# Patient Record
Sex: Female | Born: 1965 | Hispanic: Yes | Smoking: Current every day smoker
Health system: Southern US, Community
[De-identification: ages and names within clinical notes are randomized; demographics above are authoritative.]

## PROBLEM LIST (undated history)

## (undated) DIAGNOSIS — F419 Anxiety disorder, unspecified: Secondary | ICD-10-CM

## (undated) DIAGNOSIS — I10 Essential (primary) hypertension: Secondary | ICD-10-CM

## (undated) DIAGNOSIS — F329 Major depressive disorder, single episode, unspecified: Secondary | ICD-10-CM

## (undated) DIAGNOSIS — F32A Depression, unspecified: Secondary | ICD-10-CM

## (undated) DIAGNOSIS — E785 Hyperlipidemia, unspecified: Secondary | ICD-10-CM

## (undated) HISTORY — PX: TUBAL LIGATION: SHX77

## (undated) HISTORY — DX: Hyperlipidemia, unspecified: E78.5

## (undated) HISTORY — DX: Anxiety disorder, unspecified: F41.9

## (undated) HISTORY — DX: Depression, unspecified: F32.A

## (undated) HISTORY — PX: TONSILLECTOMY: SUR1361

---
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
## 2004-12-12 DIAGNOSIS — I10 Essential (primary) hypertension: Secondary | ICD-10-CM

## 2004-12-12 HISTORY — DX: Essential (primary) hypertension: I10

## 2014-11-21 ENCOUNTER — Emergency Department (HOSPITAL_COMMUNITY): Payer: BC Managed Care – PPO

## 2014-11-21 ENCOUNTER — Emergency Department (HOSPITAL_COMMUNITY)
Admission: EM | Admit: 2014-11-21 | Discharge: 2014-11-21 | Disposition: A | Discharge status: Home / Self Care | Payer: BC Managed Care – PPO | Attending: Emergency Medicine | Admitting: Emergency Medicine

## 2014-11-21 ENCOUNTER — Encounter (HOSPITAL_COMMUNITY): Payer: Self-pay | Admitting: *Deleted

## 2014-11-21 DIAGNOSIS — Z720 Tobacco use: Secondary | ICD-10-CM | POA: Diagnosis not present

## 2014-11-21 DIAGNOSIS — R05 Cough: Secondary | ICD-10-CM | POA: Diagnosis present

## 2014-11-21 DIAGNOSIS — R059 Cough, unspecified: Secondary | ICD-10-CM

## 2014-11-21 DIAGNOSIS — J069 Acute upper respiratory infection, unspecified: Secondary | ICD-10-CM | POA: Diagnosis not present

## 2014-11-21 MED ORDER — DEXTROMETHORPHAN POLISTIREX 30 MG/5ML PO LQCR: 15.0000 mg | Freq: Two times a day (BID) | ORAL | Status: DC

## 2014-11-21 MED ORDER — PSEUDOEPHEDRINE HCL 30 MG PO TABS: 30.0000 mg | ORAL_TABLET | Freq: Four times a day (QID) | ORAL | Status: DC | PRN

## 2014-11-21 NOTE — Discharge Instructions (Signed)
Upper Respiratory Infection, Adult An upper respiratory infection (URI) is also sometimes known as the common cold. The upper respiratory tract includes the nose, sinuses, throat, trachea, and bronchi. Bronchi are the airways leading to the lungs. Most people improve within 1 week, but symptoms can last up to 2 weeks. A residual cough may last even longer.  CAUSES Many different viruses can infect the tissues lining the upper respiratory tract. The tissues become irritated and inflamed and often become very moist. Mucus production is also common. A cold is contagious. You can easily spread the virus to others by oral contact. This includes kissing, sharing a glass, coughing, or sneezing. Touching your mouth or nose and then touching a surface, which is then touched by another person, can also spread the virus. SYMPTOMS  Symptoms typically develop 1 to 3 days after you come in contact with a cold virus. Symptoms vary from person to person. They may include:  Runny nose.  Sneezing.  Nasal congestion.  Sinus irritation.  Sore throat.  Loss of voice (laryngitis).  Cough.  Fatigue.  Muscle aches.  Loss of appetite.  Headache.  Low-grade fever. DIAGNOSIS  You might diagnose your own cold based on familiar symptoms, since most people get a cold 2 to 3 times a year. Your caregiver can confirm this based on your exam. Most importantly, your caregiver can check that your symptoms are not due to another disease such as strep throat, sinusitis, pneumonia, asthma, or epiglottitis. Blood tests, throat tests, and X-rays are not necessary to diagnose a common cold, but they may sometimes be helpful in excluding other more serious diseases. Your caregiver will decide if any further tests are required. RISKS AND COMPLICATIONS  You may be at risk for a more severe case of the common cold if you smoke cigarettes, have chronic heart disease (such as heart failure) or lung disease (such as asthma), or if  you have a weakened immune system. The very young and very old are also at risk for more serious infections. Bacterial sinusitis, middle ear infections, and bacterial pneumonia can complicate the common cold. The common cold can worsen asthma and chronic obstructive pulmonary disease (COPD). Sometimes, these complications can require emergency medical care and may be life-threatening. PREVENTION  The best way to protect against getting a cold is to practice good hygiene. Avoid oral or hand contact with people with cold symptoms. Wash your hands often if contact occurs. There is no clear evidence that vitamin C, vitamin E, echinacea, or exercise reduces the chance of developing a cold. However, it is always recommended to get plenty of rest and practice good nutrition. TREATMENT  Treatment is directed at relieving symptoms. There is no cure. Antibiotics are not effective, because the infection is caused by a virus, not by bacteria. Treatment may include:  Increased fluid intake. Sports drinks offer valuable electrolytes, sugars, and fluids.  Breathing heated mist or steam (vaporizer or shower).  Eating chicken soup or other clear broths, and maintaining good nutrition.  Getting plenty of rest.  Using gargles or lozenges for comfort.  Controlling fevers with ibuprofen or acetaminophen as directed by your caregiver.  Increasing usage of your inhaler if you have asthma. Zinc gel and zinc lozenges, taken in the first 24 hours of the common cold, can shorten the duration and lessen the severity of symptoms. Pain medicines may help with fever, muscle aches, and throat pain. A variety of non-prescription medicines are available to treat congestion and runny nose. Your caregiver   can make recommendations and may suggest nasal or lung inhalers for other symptoms.  HOME CARE INSTRUCTIONS   Only take over-the-counter or prescription medicines for pain, discomfort, or fever as directed by your  caregiver.  Use a warm mist humidifier or inhale steam from a shower to increase air moisture. This may keep secretions moist and make it easier to breathe.  Drink enough water and fluids to keep your urine clear or pale yellow.  Rest as needed.  Return to work when your temperature has returned to normal or as your caregiver advises. You may need to stay home longer to avoid infecting others. You can also use a face mask and careful hand washing to prevent spread of the virus. SEEK MEDICAL CARE IF:   After the first few days, you feel you are getting worse rather than better.  You need your caregiver's advice about medicines to control symptoms.  You develop chills, worsening shortness of breath, or brown or red sputum. These may be signs of pneumonia.  You develop yellow or brown nasal discharge or pain in the face, especially when you bend forward. These may be signs of sinusitis.  You develop a fever, swollen neck glands, pain with swallowing, or white areas in the back of your throat. These may be signs of strep throat. SEEK IMMEDIATE MEDICAL CARE IF:   You have a fever.  You develop severe or persistent headache, ear pain, sinus pain, or chest pain.  You develop wheezing, a prolonged cough, cough up blood, or have a change in your usual mucus (if you have chronic lung disease).  You develop sore muscles or a stiff neck. Document Released: 05/24/2001 Document Revised: 02/20/2012 Document Reviewed: 03/05/2014 Exit[employer] Patient Information 2015 [employer]. This information is not intended to replace advice given to you by your health care provider. Make sure you discuss any questions you have with your health care provider.  

## 2014-11-21 NOTE — ED Notes (Signed)
Patient states coughing and congestion x 2 days, patient states runny nose and sinus pressure in face, patient states a little bit of blood in sputum after coughing so much,

## 2014-11-21 NOTE — ED Provider Notes (Signed)
CSN: [id number]     Arrival date & time 11/21/14  [id number] History   First MD Initiated Contact with Patient 11/21/14 0911     Chief Complaint  Patient presents with  . URI     (Consider location/radiation/quality/duration/timing/severity/associated sxs/prior Treatment) HPI Comments: Patient presents to the emergency department with chief complaint of cough, nasal congestion, and runny nose 2-3 days. She denies any associated fever or chills. Denies any chest pain except for when she is coughing. She states that she has coughed up some yellow sputum that has been blood-tinged at times. She has tried using nasal saline and Benadryl for her symptoms with no relief. There are no aggravating factors. Patient denies any sick contacts. She states that she has a heavy smoker.  The history is provided by the patient. No language interpreter was used.    History reviewed. No pertinent past medical history. Past Surgical History  Procedure Laterality Date  . Tonsillectomy     No family history on file. History  Substance Use Topics  . Smoking status: Current Every Day Smoker  . Smokeless tobacco: Not on file  . Alcohol Use: Yes   OB History    No data available     Review of Systems  Constitutional: Negative for fever and chills.  HENT: Positive for congestion, postnasal drip, sinus pressure and sore throat.   Respiratory: Positive for cough. Negative for shortness of breath.   Cardiovascular: Negative for chest pain.  Gastrointestinal: Negative for nausea, vomiting, diarrhea and constipation.  Genitourinary: Negative for dysuria.      Allergies  Asa  Home Medications   Prior to Admission medications   Medication Sig Start Date End Date Taking? Authorizing Provider  diphenhydrAMINE (SOMINEX) 25 MG tablet Take 25 mg by mouth daily as needed for allergies.   Yes Historical Provider, MD  OVER THE COUNTER MEDICATION Place 1 spray into both nostrils daily as needed (for dry nose).  Nasal spray   Yes Historical Provider, MD  Pseudoeph-Doxylamine-DM-APAP (NYQUIL PO) Take 30 mLs by mouth every 6 (six) hours as needed (for cough and cold symptoms).   Yes Historical Provider, MD   BP 137/91 mmHg  Temp(Src) 98.1 F (36.7 C) (Oral)  Resp 20  Ht 5\' 5"  (1.651 m)  Wt 120 lb (54.432 kg)  BMI 19.97 kg/m2  SpO2 100% Physical Exam  Constitutional: She appears well-developed and well-nourished. No distress.  HENT:  Head: Normocephalic.  Right Ear: External ear normal.  Left Ear: External ear normal.  Mildly erythematous, no tonsillar exudate, no abscess, no stridor, uvula is midline  TMs clear bilaterally  Eyes: Conjunctivae and EOM are normal. Pupils are equal, round, and reactive to light.  Neck: Normal range of motion. Neck supple.  Cardiovascular: Normal rate, regular rhythm and normal heart sounds.  Exam reveals no gallop and no friction rub.   No murmur heard. Pulmonary/Chest: Effort normal. No stridor. No respiratory distress. She has no wheezes. She has rales. She exhibits no tenderness.  Right-sided lower lobe crackles  Abdominal: Soft. Bowel sounds are normal. She exhibits no distension. There is no tenderness.  Musculoskeletal: Normal range of motion. She exhibits no tenderness.  Neurological: She is alert.  Skin: Skin is warm and dry. No rash noted. She is not diaphoretic.  Psychiatric: She has a normal mood and affect. Her behavior is normal. Judgment and thought content normal.  Nursing note and vitals reviewed.   ED Course  Procedures (including critical care time) Labs Review Labs Reviewed -  No data to display  Imaging Review Dg Chest 2 View  11/21/2014   CLINICAL DATA:  Cough.  EXAM: CHEST  2 VIEW  COMPARISON:  None.  FINDINGS: The heart size and mediastinal contours are within normal limits. Both lungs are clear. No pneumothorax or pleural effusion is noted. The visualized skeletal structures are unremarkable.  IMPRESSION: No acute cardiopulmonary  abnormality seen.   Electronically Signed   By: Roque LiasJames  Green M.D.   On: 11/21/2014 10:02     EKG Interpretation None      MDM   Final diagnoses:  Cough  URI, acute    Pt CXR negative for acute infiltrate. Patients symptoms are consistent with URI, likely viral etiology. Discussed that antibiotics are not indicated for viral infections. Pt will be discharged with symptomatic treatment.  Verbalizes understanding and is agreeable with plan. Pt is hemodynamically stable & in NAD prior to dc.     Roxy Horsemanobert Yuniel Blaney, PA-C 11/21/14 1014  Joya Gaskinsonald W Wickline, MD 11/21/14 1254

## 2016-05-05 ENCOUNTER — Ambulatory Visit (HOSPITAL_COMMUNITY)
Admission: EM | Admit: 2016-05-05 | Discharge: 2016-05-05 | Disposition: A | Discharge status: Home / Self Care | Payer: BLUE CROSS/BLUE SHIELD | Attending: Family Medicine | Admitting: Family Medicine

## 2016-05-05 ENCOUNTER — Encounter (HOSPITAL_COMMUNITY): Payer: Self-pay | Admitting: Emergency Medicine

## 2016-05-05 DIAGNOSIS — F418 Other specified anxiety disorders: Secondary | ICD-10-CM | POA: Diagnosis not present

## 2016-05-05 MED ORDER — LORAZEPAM 1 MG PO TABS: 1.0000 mg | ORAL_TABLET | Freq: Two times a day (BID) | ORAL | Status: DC

## 2016-05-05 NOTE — ED Notes (Signed)
Pt reports stress at work due to coworker and feeling like BP has been elevated and feeling anxious Hx of HTN... PCP adv pt to hold off atenolol 25 mg until physical exam A&O x4... No acute distress.

## 2016-05-05 NOTE — ED Provider Notes (Signed)
CSN: [id number]     Arrival date & time 05/05/16  1401 History   First MD Initiated Contact with Patient 05/05/16 1456     Chief Complaint  Patient presents with  . Stress   (Consider location/radiation/quality/duration/timing/severity/associated sxs/prior Treatment) Patient is a 50 y.o. female presenting with mental health disorder. The history is provided by the patient.  Mental Health Problem Presenting symptoms: no self mutilation and no suicidal thoughts   Presenting symptoms comment:  Pt with interpersonal problem with coworker, now stressed. Degree of incapacity (severity):  Mild Onset quality:  Gradual Duration:  3 days Chronicity:  New Relieved by:  None tried Worsened by:  Nothing tried Ineffective treatments:  None tried Associated symptoms: anxiety and insomnia   Risk factors: no hx of suicide attempts     History reviewed. No pertinent past medical history. Past Surgical History  Procedure Laterality Date  . Tonsillectomy     No family history on file. Social History  Substance Use Topics  . Smoking status: Current Every Day Smoker  . Smokeless tobacco: None  . Alcohol Use: Yes   OB History    No data available     Review of Systems  Constitutional: Negative.   Skin: Negative.   Psychiatric/Behavioral: Positive for sleep disturbance. Negative for suicidal ideas, behavioral problems and self-injury. The patient is nervous/anxious and has insomnia.   All other systems reviewed and are negative.   Allergies  Asa  Home Medications   Prior to Admission medications   Medication Sig Start Date End Date Taking? Authorizing Provider  dextromethorphan (DELSYM) 30 MG/5ML liquid Take 2.5 mLs (15 mg total) by mouth 2 (two) times daily. 11/21/14   Roxy Horseman, PA-C  diphenhydrAMINE (SOMINEX) 25 MG tablet Take 25 mg by mouth daily as needed for allergies.    Historical Provider, MD  LORazepam (ATIVAN) 1 MG tablet Take 1 tablet (1 mg total) by mouth 2 (two)  times daily. For anxiety 05/05/16   Linna Hoff, MD  OVER THE COUNTER MEDICATION Place 1 spray into both nostrils daily as needed (for dry nose). Nasal spray    Historical Provider, MD  Pseudoeph-Doxylamine-DM-APAP (NYQUIL PO) Take 30 mLs by mouth every 6 (six) hours as needed (for cough and cold symptoms).    Historical Provider, MD  pseudoephedrine (SUDAFED) 30 MG tablet Take 1 tablet (30 mg total) by mouth every 6 (six) hours as needed for congestion. 11/21/14   Roxy Horseman, PA-C   Meds Ordered and Administered this Visit  Medications - No data to display  BP 142/88 mmHg  Pulse 96  Temp(Src) 98.6 F (37 C) (Oral)  Resp 16  SpO2 100% No data found.   Physical Exam  Constitutional: She is oriented to person, place, and time. She appears well-developed and well-nourished.  Cardiovascular: Regular rhythm and normal heart sounds.   Pulmonary/Chest: Effort normal and breath sounds normal.  Neurological: She is alert and oriented to person, place, and time.  Skin: Skin is warm and dry.  Psychiatric: Her behavior is normal.  Nursing note and vitals reviewed.   ED Course  Procedures (including critical care time)  Labs Review Labs Reviewed - No data to display  Imaging Review No results found.   Visual Acuity Review  Right Eye Distance:   Left Eye Distance:   Bilateral Distance:    Right Eye Near:   Left Eye Near:    Bilateral Near:         MDM   1. Situational anxiety  Linna HoffJames D Kindl, MD 05/05/16 [id number]

## 2016-05-13 ENCOUNTER — Other Ambulatory Visit (HOSPITAL_COMMUNITY)
Admission: RE | Admit: 2016-05-13 | Discharge: 2016-05-13 | Disposition: A | Discharge status: Home / Self Care | Payer: BLUE CROSS/BLUE SHIELD | Source: Ambulatory Visit | Attending: Family Medicine | Admitting: Family Medicine

## 2016-05-13 ENCOUNTER — Other Ambulatory Visit: Payer: Self-pay

## 2016-05-13 ENCOUNTER — Other Ambulatory Visit: Payer: Self-pay | Admitting: Family

## 2016-05-13 DIAGNOSIS — Z01411 Encounter for gynecological examination (general) (routine) with abnormal findings: Secondary | ICD-10-CM | POA: Diagnosis not present

## 2016-05-13 DIAGNOSIS — Z1231 Encounter for screening mammogram for malignant neoplasm of breast: Secondary | ICD-10-CM

## 2016-05-19 LAB — CYTOLOGY - PAP

## 2017-12-24 ENCOUNTER — Other Ambulatory Visit: Payer: Self-pay

## 2017-12-24 ENCOUNTER — Ambulatory Visit (HOSPITAL_COMMUNITY)
Admission: EM | Admit: 2017-12-24 | Discharge: 2017-12-24 | Disposition: A | Discharge status: Home / Self Care | Payer: BLUE CROSS/BLUE SHIELD | Attending: Family Medicine | Admitting: Family Medicine

## 2017-12-24 DIAGNOSIS — R51 Headache: Principal | ICD-10-CM

## 2017-12-24 DIAGNOSIS — R519 Headache, unspecified: Secondary | ICD-10-CM

## 2017-12-24 MED ORDER — BUTALBITAL-APAP-CAFFEINE 50-325-40 MG PO TABS: 1.0000 | ORAL_TABLET | Freq: Four times a day (QID) | ORAL | 0 refills | Status: AC | PRN

## 2017-12-24 MED ORDER — SERTRALINE HCL 50 MG PO TABS: 50.0000 mg | ORAL_TABLET | Freq: Every day | ORAL | 1 refills | Status: DC

## 2017-12-24 NOTE — ED Provider Notes (Signed)
  [facility]   [id number] 12/24/17 Arrival Time: 1726   SUBJECTIVE:  Anne Combs is a 52 y.o. female who presents to the urgent care with complaint of left sided headache.  Patient works for [employer] and is married and has been under great stress because her husband is being treated for cancer his last several months.  Patient has lost power in her house.  She lost interest in having relations with her husband, is not sleeping well, and feels tired much of the time.  She is originally from [place]  No past medical history on file. No family history on file. Social History   Socioeconomic History  . Marital status: Single    Spouse name: Not on file  . Number of children: Not on file  . Years of education: Not on file  . Highest education level: Not on file  Social Needs  . Financial resource strain: Not on file  . Food insecurity - worry: Not on file  . Food insecurity - inability: Not on file  . Transportation needs - medical: Not on file  . Transportation needs - non-medical: Not on file  Occupational History  . Not on file  Tobacco Use  . Smoking status: Current Every Day Smoker  Substance and Sexual Activity  . Alcohol use: Yes  . Drug use: Not on file  . Sexual activity: No  Other Topics Concern  . Not on file  Social History Narrative  . Not on file   No outpatient medications have been marked as taking for the 12/24/17 encounter [facility](Hospital Encounter).   Allergies  Allergen Reactions  . Asa [Aspirin] Itching    One eye itches      ROS: As per HPI, remainder of ROS negative.   OBJECTIVE:   Vitals:   12/24/17 1830  BP: (!) 151/98  Pulse: 100  Temp: 97.9 F (36.6 C)  SpO2: 98%     General appearance: alert; no distress Eyes: PERRL; EOMI; conjunctiva normal HENT: normocephalic; atraumatic; TMs normal, canal normal, external ears normal without trauma; nasal mucosa normal; oral mucosa normal Neck: supple Lungs:  clear to auscultation bilaterally Heart: regular rate and rhythm Back: no CVA tenderness Extremities: no cyanosis or edema; symmetrical with no gross deformities Skin: warm and dry Neurologic: normal gait; grossly normal Psychological: alert and cooperative; normal mood and affect      Labs:  Results for orders placed or performed in visit on 05/13/16  Cytology - PAP  Result Value Ref Range   CYTOLOGY - PAP PAP RESULT     Labs Reviewed - No data to display  No results found.     ASSESSMENT & PLAN:  1. Bad headache     Meds ordered this encounter  Medications  . sertraline (ZOLOFT) 50 MG tablet    Sig: Take 1 tablet (50 mg total) by mouth daily.    Dispense:  30 tablet    Refill:  1  . butalbital-acetaminophen-caffeine (FIORICET, ESGIC) 50-325-40 MG tablet    Sig: Take 1-2 tablets by mouth every 6 (six) hours as needed for headache.    Dispense:  20 tablet    Refill:  0    Reviewed expectations re: course of current medical issues. Questions answered. Outlined signs and symptoms indicating need for more acute intervention. Patient verbalized understanding. After Visit Summary given.    Procedures:      Elvina SidleLauenstein, Kenston Longton, MD 12/24/17 1849

## 2017-12-24 NOTE — ED Triage Notes (Signed)
Per pt she is having pain on her left temple, Per pt she is going threw a lot of stress in her life right now.

## 2018-02-04 ENCOUNTER — Emergency Department (HOSPITAL_COMMUNITY)
Admission: EM | Admit: 2018-02-04 | Discharge: 2018-02-04 | Disposition: A | Discharge status: Home / Self Care | Payer: BLUE CROSS/BLUE SHIELD | Attending: Emergency Medicine | Admitting: Emergency Medicine

## 2018-02-04 ENCOUNTER — Emergency Department (HOSPITAL_COMMUNITY): Payer: BLUE CROSS/BLUE SHIELD

## 2018-02-04 ENCOUNTER — Encounter (HOSPITAL_COMMUNITY): Payer: Self-pay | Admitting: Emergency Medicine

## 2018-02-04 DIAGNOSIS — M79645 Pain in left finger(s): Secondary | ICD-10-CM | POA: Diagnosis not present

## 2018-02-04 DIAGNOSIS — X58XXXA Exposure to other specified factors, initial encounter: Secondary | ICD-10-CM | POA: Insufficient documentation

## 2018-02-04 DIAGNOSIS — Y999 Unspecified external cause status: Secondary | ICD-10-CM | POA: Insufficient documentation

## 2018-02-04 DIAGNOSIS — Y939 Activity, unspecified: Secondary | ICD-10-CM | POA: Insufficient documentation

## 2018-02-04 DIAGNOSIS — Y929 Unspecified place or not applicable: Secondary | ICD-10-CM | POA: Insufficient documentation

## 2018-02-04 DIAGNOSIS — Z79899 Other long term (current) drug therapy: Secondary | ICD-10-CM | POA: Insufficient documentation

## 2018-02-04 DIAGNOSIS — F17200 Nicotine dependence, unspecified, uncomplicated: Secondary | ICD-10-CM | POA: Diagnosis not present

## 2018-02-04 MED ORDER — ACETAMINOPHEN 325 MG PO TABS
650.0000 mg | ORAL_TABLET | Freq: Once | ORAL | Status: AC
Start: 2018-02-04 — End: 2018-02-04
  Administered 2018-02-04: 650 mg via ORAL
  Filled 2018-02-04: qty 2

## 2018-02-04 NOTE — ED Provider Notes (Signed)
[facility] EMERGENCY DEPARTMENT Provider Note   CSN: [id number] Arrival date & time: 02/04/18  0744     History   Chief Complaint Chief Complaint  Patient presents with  . Finger Injury    HPI Anne Combs is a 52 y.o. female who presents today for evaluation of left thumb and wrist pain.  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  She reports that she has chronic left thumb pain, however approximately 1 week ago she bent her thumb back causing a significant increase in her pain.  She reports that she is able to move the tip of her thumb, however moving the base of her thumb causes significant pain.  No numbness or tingling.  She does not have a primary care doctor.  No swelling or redness, no fevers or chills.    She has not tried tylenol, ibuprofen, ice or heat.  She tried wearing a compression fingerless glove with out relief.   HPI  History reviewed. No pertinent past medical history.  There are no active problems to display for this patient.   Past Surgical History:  Procedure Laterality Date  . TONSILLECTOMY      OB History    No data available       Home Medications    Prior to Admission medications   Medication Sig Start Date End Date Taking? Authorizing Provider  butalbital-acetaminophen-caffeine (FIORICET, ESGIC) [phone] MG tablet Take 1-2 tablets by mouth every 6 (six) hours as needed for headache. 12/24/17 12/24/18  Elvina Sidle, MD  dextromethorphan (DELSYM) 30 MG/5ML liquid Take 2.5 mLs (15 mg total) by mouth 2 (two) times daily. 11/21/14   Roxy Horseman, PA-C  diphenhydrAMINE (SOMINEX) 25 MG tablet Take 25 mg by mouth daily as needed for allergies.    [provider]  LORazepam (ATIVAN) 1 MG tablet Take 1 tablet (1 mg total) by mouth 2 (two) times daily. For anxiety 05/05/16   Linna Hoff, MD  OVER THE COUNTER MEDICATION Place 1 spray  into both nostrils daily as needed (for dry nose). Nasal spray    [provider]  Pseudoeph-Doxylamine-DM-APAP (NYQUIL PO) Take 30 mLs by mouth every 6 (six) hours as needed (for cough and cold symptoms).    [provider]  pseudoephedrine (SUDAFED) 30 MG tablet Take 1 tablet (30 mg total) by mouth every 6 (six) hours as needed for congestion. 11/21/14   Roxy Horseman, PA-C  sertraline (ZOLOFT) 50 MG tablet Take 1 tablet (50 mg total) by mouth daily. 12/24/17   Elvina Sidle, MD    Family History History reviewed. No pertinent family history.  Social History Social History   Tobacco Use  . Smoking status: Current Every Day Smoker  . Smokeless tobacco: Never Used  Substance Use Topics  . Alcohol use: Yes  . Drug use: Not on file     Allergies   Asa [aspirin]   Review of Systems Review of Systems  Constitutional: Negative for chills and fever.  Musculoskeletal: Negative for neck pain and neck stiffness.       Left thumb pain  Skin: Negative for color change, rash and wound.  All other systems reviewed and are negative.    Physical Exam Updated Vital Signs BP (!) 167/83 (BP Location: Left Arm)   Pulse (!) 108   Temp 98.3 F (36.8 C) (Oral)   Resp 18   SpO2 100%   Physical  Exam  Constitutional: She appears well-developed and well-nourished.  Neck: Normal range of motion. Neck supple.  No midline TTP.  Cardiovascular:  Left hand is warm and well perfused  Musculoskeletal:  Left thumb pain with active and passive ROM.  She does not have any crepitus.  Her pain is worse at the first mcp joint and Pip joint.  TTP along dorsal aspect over the first dorsal compartment.    Neurological:  Tach sensation to left hand  Skin: Skin is warm and dry. No rash noted. She is not diaphoretic. No erythema. No pallor.  Psychiatric: She has a normal mood and affect. Her behavior is normal.  Nursing note and vitals reviewed.    ED Treatments / Results   Labs (all labs ordered are listed, but only abnormal results are displayed) Labs Reviewed - No data to display  EKG  EKG Interpretation None       Radiology Dg Hand Complete Left  Result Date: 02/04/2018 CLINICAL DATA:  Pain after trauma 2 weeks ago EXAM: LEFT HAND - COMPLETE 3+ VIEW COMPARISON:  None. FINDINGS: There is no evidence of fracture or dislocation. There is no evidence of arthropathy or other focal bone abnormality. Soft tissues are unremarkable. IMPRESSION: Negative. Electronically Signed   By: Gerome Samavid  Williams III M.D   On: 02/04/2018 08:13    Procedures Procedures (including critical care time)  Medications Ordered in ED Medications  acetaminophen (TYLENOL) tablet 650 mg (not administered)     Initial Impression / Assessment and Plan / ED Course  I have reviewed the triage vital signs and the nursing notes.  Pertinent labs & imaging results that were available during my care of the patient were reviewed by me and considered in my medical decision making (see chart for details).     Anne Combs presents today for evaluation of left thumb pain.  Her pain is acute on chronic, made worse with general range of motion with tenderness to palpation over the left first dorsal compartment.  X-rays obtained without acute abnormalities.  She has not been trying anything at home for her pain.  Her pain was treated in the emergency room with Tylenol, and she was given a left-sided thumb spica splint.  She was given a work note, given follow-up with hand if her symptoms do not improve in 2 weeks.  OTC pain meds as needed.   This chart was prepared with the assistance of Dragon voice to text software and may contain unintentional word substations including sound alike words.  Final Clinical Impressions(s) / ED Diagnoses   Final diagnoses:  Thumb pain, left    ED Discharge Orders    None       Norman ClayHammond, Naseem Adler W, PA-C 02/04/18 [id number]    Jacalyn LefevreHaviland, Julie,  MD 02/04/18 [phone]

## 2018-02-04 NOTE — Discharge Instructions (Signed)

## 2018-02-04 NOTE — ED Notes (Signed)
Ortho tech paged for left thumb spica

## 2018-02-04 NOTE — ED Triage Notes (Signed)
Pt to ER for evaluation of left wrist and thumb pain after bending her thumb back 2 weeks ago. States pain with movement.

## 2018-02-04 NOTE — Progress Notes (Signed)
Orthopedic Tech Progress Note Patient Details:  Anne LawsMagaly Combs 1966/03/05 [id number]  Ortho Devices Type of Ortho Device: Thumb velcro splint Ortho Device/Splint Interventions: Application   Post Interventions Patient Tolerated: Well Instructions Provided: Care of device   Saul FordyceJennifer C Dossie Ocanas 02/04/2018, 9:48 AM

## 2018-03-07 ENCOUNTER — Ambulatory Visit (INDEPENDENT_AMBULATORY_CARE_PROVIDER_SITE_OTHER): Payer: BLUE CROSS/BLUE SHIELD | Admitting: Physician Assistant

## 2018-03-20 ENCOUNTER — Ambulatory Visit (INDEPENDENT_AMBULATORY_CARE_PROVIDER_SITE_OTHER): Payer: BLUE CROSS/BLUE SHIELD | Admitting: Physician Assistant

## 2019-03-11 ENCOUNTER — Ambulatory Visit (HOSPITAL_COMMUNITY)
Admission: EM | Admit: 2019-03-11 | Discharge: 2019-03-11 | Disposition: A | Discharge status: Home / Self Care | Payer: BLUE CROSS/BLUE SHIELD | Attending: Family Medicine | Admitting: Family Medicine

## 2019-03-11 ENCOUNTER — Other Ambulatory Visit: Payer: Self-pay

## 2019-03-11 ENCOUNTER — Encounter (HOSPITAL_COMMUNITY): Payer: Self-pay | Admitting: Emergency Medicine

## 2019-03-11 DIAGNOSIS — K047 Periapical abscess without sinus: Secondary | ICD-10-CM

## 2019-03-11 MED ORDER — AMOXICILLIN-POT CLAVULANATE 875-125 MG PO TABS: 1.0000 | ORAL_TABLET | Freq: Two times a day (BID) | ORAL | 0 refills | Status: DC

## 2019-03-11 MED ORDER — NAPROXEN 500 MG PO TABS: 500.0000 mg | ORAL_TABLET | Freq: Two times a day (BID) | ORAL | 0 refills | Status: DC

## 2019-03-11 NOTE — ED Notes (Signed)
Patient verbalizes understanding of discharge instructions. Opportunity for questioning and answers were provided. Patient discharged from [facility] by NP.  

## 2019-03-11 NOTE — Discharge Instructions (Signed)
Augmentin for dental infection Naproxen for pain.  Dental resources printed out.  Follow up as needed for continued or worsening symptoms

## 2019-03-11 NOTE — ED Provider Notes (Signed)
[facility]    CSN: [id number] Arrival date & time: 03/11/19  1040     History   Chief Complaint Chief Complaint  Patient presents with  . Dental Pain    HPI Anne Combs is a 53 y.o. female.    Dental Pain  Location:  Lower Quality:  Pulsating and constant Severity:  Moderate Onset quality:  Sudden Duration:  2 days Timing:  Constant Progression:  Worsening Context: dental caries and poor dentition   Context: not abscess and not dental fracture   Worsened by:  Touching, jaw movement and pressure Ineffective treatments:  Acetaminophen Associated symptoms: facial pain, facial swelling and gum swelling   Associated symptoms: no congestion, no drooling, no fever, no headaches, no neck pain, no neck swelling, no oral bleeding, no oral lesions and no trismus   Risk factors: lack of dental care and periodontal disease     History reviewed. No pertinent past medical history.  There are no active problems to display for this patient.   Past Surgical History:  Procedure Laterality Date  . TONSILLECTOMY      OB History   No obstetric history on file.      Home Medications    Prior to Admission medications   Medication Sig Start Date End Date Taking? Authorizing Provider  amoxicillin-clavulanate (AUGMENTIN) 875-125 MG tablet Take 1 tablet by mouth every 12 (twelve) hours. 03/11/19   Tijana Walder, Gloris Manchester A, NP  naproxen (NAPROSYN) 500 MG tablet Take 1 tablet (500 mg total) by mouth 2 (two) times daily. 03/11/19   Urijah Arko, Gloris Manchester A, NP  OVER THE COUNTER MEDICATION Place 1 spray into both nostrils daily as needed (for dry nose). Nasal spray    [provider]  sertraline (ZOLOFT) 50 MG tablet Take 1 tablet (50 mg total) by mouth daily. 12/24/17   Elvina Sidle, MD    Family History History reviewed. No pertinent family history.  Social History Social History   Tobacco Use  . Smoking status: Current Every Day Smoker  . Smokeless tobacco: Never Used   Substance Use Topics  . Alcohol use: Yes  . Drug use: Not on file     Allergies   Asa [aspirin] and Sulfa antibiotics   Review of Systems Review of Systems  Constitutional: Negative for fever.  HENT: Positive for facial swelling. Negative for congestion, drooling and mouth sores.   Musculoskeletal: Negative for neck pain.  Neurological: Negative for headaches.     Physical Exam Triage Vital Signs ED Triage Vitals [03/11/19 1103]  Enc Vitals Group     BP (!) 142/89     Pulse Rate 99     Resp 18     Temp 97.9 F (36.6 C)     Temp Source Oral     SpO2 99 %     Weight      Height      Head Circumference      Peak Flow      Pain Score 8     Pain Loc      Pain Edu?      Excl. in GC?    No data found.  Updated Vital Signs BP (!) 142/89 (BP Location: Right Arm)   Pulse 99   Temp 97.9 F (36.6 C) (Oral)   Resp 18   SpO2 99%   Visual Acuity Right Eye Distance:   Left Eye Distance:   Bilateral Distance:    Right Eye Near:   Left Eye Near:  Bilateral Near:     Physical Exam Vitals signs and nursing note reviewed.  Constitutional:      General: She is not in acute distress.    Appearance: Normal appearance. She is not ill-appearing, toxic-appearing or diaphoretic.  HENT:     Head: Normocephalic and atraumatic.     Nose: Nose normal.     Mouth/Throat:     Lips: Pink.     Mouth: Mucous membranes are moist.     Dentition: Dental tenderness, gingival swelling and dental caries present.     Pharynx: Oropharynx is clear. Uvula midline.  Eyes:     Conjunctiva/sclera: Conjunctivae normal.  Neck:     Musculoskeletal: Normal range of motion.  Pulmonary:     Effort: Pulmonary effort is normal.  Neurological:     Mental Status: She is alert.      UC Treatments / Results  Labs (all labs ordered are listed, but only abnormal results are displayed) Labs Reviewed - No data to display  EKG None  Radiology No results found.  Procedures Procedures  (including critical care time)  Medications Ordered in UC Medications - No data to display  Initial Impression / Assessment and Plan / UC Course  I have reviewed the triage vital signs and the nursing notes.  Pertinent labs & imaging results that were available during my care of the patient were reviewed by me and considered in my medical decision making (see chart for details).     Dental infection-  Treating for dental infection Augmentin twice a day for 7 days Naproxen twice a day as needed for pain and inflammation Dental resources given Instructed that if her symptoms continue or worsen despite treatment she will need to follow-up Final Clinical Impressions(s) / UC Diagnoses   Final diagnoses:  Dental infection     Discharge Instructions     Augmentin for dental infection Naproxen for pain.  Dental resources printed out.  Follow up as needed for continued or worsening symptoms      ED Prescriptions    Medication Sig Dispense Auth. Provider   amoxicillin-clavulanate (AUGMENTIN) 875-125 MG tablet Take 1 tablet by mouth every 12 (twelve) hours. 14 tablet Konica Stankowski A, NP   naproxen (NAPROSYN) 500 MG tablet Take 1 tablet (500 mg total) by mouth 2 (two) times daily. 30 tablet Dahlia Byes A, NP     Controlled Substance Prescriptions [place] Controlled Substance Registry consulted? Not Applicable   Janace Aris, NP 03/11/19 1139

## 2019-03-11 NOTE — ED Triage Notes (Signed)
Pt here for left sided dental pain with some swelling

## 2019-08-08 ENCOUNTER — Ambulatory Visit: Payer: Self-pay | Admitting: Internal Medicine

## 2019-09-27 ENCOUNTER — Other Ambulatory Visit: Payer: Self-pay

## 2019-09-27 ENCOUNTER — Ambulatory Visit (INDEPENDENT_AMBULATORY_CARE_PROVIDER_SITE_OTHER): Payer: Self-pay | Admitting: Licensed Clinical Social Worker

## 2019-09-27 DIAGNOSIS — F411 Generalized anxiety disorder: Secondary | ICD-10-CM

## 2019-09-27 DIAGNOSIS — F4323 Adjustment disorder with mixed anxiety and depressed mood: Secondary | ICD-10-CM
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
## 2019-10-02 ENCOUNTER — Encounter: Payer: Self-pay | Admitting: Licensed Clinical Social Worker

## 2019-10-04 ENCOUNTER — Ambulatory Visit: Payer: Self-pay | Admitting: Internal Medicine

## 2019-10-04 ENCOUNTER — Encounter: Payer: Self-pay | Admitting: Internal Medicine

## 2019-10-04 ENCOUNTER — Other Ambulatory Visit: Payer: Self-pay

## 2019-10-04 VITALS — BP 122/78 | HR 66 | Resp 12 | Ht 59.5 in | Wt 137.0 lb

## 2019-10-04 DIAGNOSIS — Z720 Tobacco use: Secondary | ICD-10-CM

## 2019-10-04 DIAGNOSIS — Z23 Encounter for immunization: Secondary | ICD-10-CM

## 2019-10-04 DIAGNOSIS — F329 Major depressive disorder, single episode, unspecified: Secondary | ICD-10-CM

## 2019-10-04 DIAGNOSIS — E782 Mixed hyperlipidemia: Secondary | ICD-10-CM

## 2019-10-04 DIAGNOSIS — I10 Essential (primary) hypertension: Secondary | ICD-10-CM

## 2019-10-04 DIAGNOSIS — F419 Anxiety disorder, unspecified: Secondary | ICD-10-CM

## 2019-10-04 DIAGNOSIS — E785 Hyperlipidemia, unspecified: Secondary | ICD-10-CM | POA: Insufficient documentation

## 2019-10-04 MED ORDER — CITALOPRAM HYDROBROMIDE 10 MG PO TABS: 10.0000 mg | ORAL_TABLET | Freq: Every day | ORAL | 2 refills | Status: DC
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
## 2019-10-07 ENCOUNTER — Other Ambulatory Visit: Payer: Self-pay

## 2019-10-07 ENCOUNTER — Telehealth (INDEPENDENT_AMBULATORY_CARE_PROVIDER_SITE_OTHER): Payer: Self-pay | Admitting: Licensed Clinical Social Worker

## 2019-10-07 DIAGNOSIS — E782 Mixed hyperlipidemia: Secondary | ICD-10-CM

## 2019-10-07 DIAGNOSIS — F4323 Adjustment disorder with mixed anxiety and depressed mood: Secondary | ICD-10-CM

## 2019-10-07 DIAGNOSIS — F411 Generalized anxiety disorder: Secondary | ICD-10-CM

## 2019-10-07 DIAGNOSIS — F332 Major depressive disorder, recurrent severe without psychotic features: Secondary | ICD-10-CM

## 2019-10-07 DIAGNOSIS — Z79899 Other long term (current) drug therapy: Secondary | ICD-10-CM

## 2019-10-08 LAB — LIPID PANEL W/O CHOL/HDL RATIO
Cholesterol, Total: 275 mg/dL — ABNORMAL HIGH (ref 100–199)
HDL: 42 mg/dL (ref >39)
LDL Chol Calc (NIH): 200 mg/dL — ABNORMAL HIGH (ref 0–99)
Triglycerides: 174 mg/dL — ABNORMAL HIGH (ref 0–149)
VLDL Cholesterol Cal: 33 mg/dL (ref 5–40)

## 2019-10-08 LAB — COMPREHENSIVE METABOLIC PANEL
ALT: 29 IU/L (ref 0–32)
AST: 21 IU/L (ref 0–40)
Albumin/Globulin Ratio: 1.9 (ref 1.2–2.2)
Albumin: 4.6 g/dL (ref 3.8–4.9)
Alkaline Phosphatase: 93 IU/L (ref 39–117)
BUN/Creatinine Ratio: 18 (ref 9–23)
BUN: 12 mg/dL (ref 6–24)
Bilirubin Total: 0.3 mg/dL (ref 0.0–1.2)
CO2: 25 mmol/L (ref 20–29)
Calcium: 9.3 mg/dL (ref 8.7–10.2)
Chloride: 103 mmol/L (ref 96–106)
Creatinine, Ser: 0.67 mg/dL (ref 0.57–1.00)
GFR calc Af Amer: 115 mL/min/{1.73_m2} (ref >59)
GFR calc non Af Amer: 100 mL/min/{1.73_m2} (ref >59)
Globulin, Total: 2.4 g/dL (ref 1.5–4.5)
Glucose: 121 mg/dL — ABNORMAL HIGH (ref 65–99)
Potassium: 4.9 mmol/L (ref 3.5–5.2)
Sodium: 143 mmol/L (ref 134–144)
Total Protein: 7 g/dL (ref 6.0–8.5)

## 2019-10-08 LAB — CBC WITH DIFFERENTIAL/PLATELET
Basophils Absolute: 0 10*3/uL (ref 0.0–0.2)
Basos: 0 %
EOS (ABSOLUTE): 0.2 10*3/uL (ref 0.0–0.4)
Eos: 2 %
Hematocrit: 42.9 % (ref 34.0–46.6)
Hemoglobin: 14.4 g/dL (ref 11.1–15.9)
Immature Grans (Abs): 0 10*3/uL (ref 0.0–0.1)
Immature Granulocytes: 0 %
Lymphocytes Absolute: 3.3 10*3/uL — ABNORMAL HIGH (ref 0.7–3.1)
Lymphs: 34 %
MCH: 29.6 pg (ref 26.6–33.0)
MCHC: 33.6 g/dL (ref 31.5–35.7)
MCV: 88 fL (ref 79–97)
Monocytes Absolute: 0.6 10*3/uL (ref 0.1–0.9)
Monocytes: 6 %
Neutrophils Absolute: 5.7 10*3/uL (ref 1.4–7.0)
Neutrophils: 58 %
Platelets: 254 10*3/uL (ref 150–450)
RBC: 4.87 x10E6/uL (ref 3.77–5.28)
RDW: 12.6 % (ref 11.7–15.4)
WBC: 9.9 10*3/uL (ref 3.4–10.8)

## 2019-10-14 ENCOUNTER — Ambulatory Visit: Payer: Self-pay | Admitting: Internal Medicine

## 2019-10-16 DIAGNOSIS — F4323 Adjustment disorder with mixed anxiety and depressed mood: Secondary | ICD-10-CM | POA: Insufficient documentation

## 2019-10-16 DIAGNOSIS — F411 Generalized anxiety disorder: Secondary | ICD-10-CM | POA: Insufficient documentation

## 2019-10-21 ENCOUNTER — Ambulatory Visit: Payer: BLUE CROSS/BLUE SHIELD | Admitting: Family Medicine

## 2019-10-21 ENCOUNTER — Telehealth: Payer: Self-pay | Admitting: Licensed Clinical Social Worker

## 2019-10-23 ENCOUNTER — Telehealth: Payer: Self-pay | Admitting: Licensed Clinical Social Worker

## 2019-10-23 DIAGNOSIS — F332 Major depressive disorder, recurrent severe without psychotic features: Secondary | ICD-10-CM | POA: Insufficient documentation

## 2019-10-23 NOTE — Progress Notes (Signed)
   THERAPY PROGRESS NOTE  Session Time: 10:15  Participation Level: Active  Behavioral Response: CasualAlertAnxious and Depressed  Type of Therapy: Individual Therapy  Treatment Goals addressed: Communication: with son and daughter-in-law and Coping  Interventions: Solution Focused, Strength-based and Family Systems  Summary: Anne Combs is a 53 y.o. female who presents with depression and anxiety. Patient reports things are a little bit better at home and she was able to speak to her son and daughter-in-law about her goals of moving out of the house. Patient reports her son stated he was going to help her find an apartment. Patient reports her main concern is she does not want to live alone. Patient reports she still feels depressed and upset when she thinks about her husband. Patient reports she is trying to keep her mind busy by creating goals and tasks that help her move out of the house. Patient reports she is actively looking for work, but she cannot live in an apartment that does not accept the dogs. Patient reports she is sleeping better, but she still feels an overwhelming need to cry for no reason.  Suicidal/Homicidal: Nowithout intent/plan  Therapist Response:  The focus of today's session was on helping the patient identify triggers which increase feelings of depression and anxiety. The main therapeutic techniques are Communication; using "I statements" and being specific when speaking about her feelings and concerns, and communicating when feelings are not heightened. Therapeutic efforts also included solution-focused techniques to help develop goals and tasks; identification for strength-based actions and thoughts; speaking about family-systems and the roles each family member has in the home.. The importance of meditation was reviewed.  Mental status General appearance/Behavior: Casual Eye contact: Good Motor behavior: Normal Speech: Normal, Rate:WNL and  Volume:WNL Level of consciousness: Alert Mood: Anxious and Depressed Affect: Appropriate and Depressed Anxiety level: Minimal Thought process: Coherent and Circumstantial Thought content: WNL Perception: Normal Judgment: Fair Insight: Present; poor  Plan: Return again in 1 week.  Diagnosis: Major Depressive Disorder; severe; recurring and Generalized Anxiety Disorder    Adelene Amas, Latanya Presser 10/23/2019

## 2019-12-15 ENCOUNTER — Encounter: Payer: Self-pay | Admitting: Internal Medicine

## 2019-12-15 DIAGNOSIS — F419 Anxiety disorder, unspecified: Secondary | ICD-10-CM | POA: Insufficient documentation

## 2019-12-15 DIAGNOSIS — F329 Major depressive disorder, single episode, unspecified: Secondary | ICD-10-CM | POA: Insufficient documentation

## 2019-12-15 DIAGNOSIS — Z720 Tobacco use: Secondary | ICD-10-CM | POA: Insufficient documentation

## 2020-01-16 ENCOUNTER — Ambulatory Visit: Payer: Self-pay | Admitting: Internal Medicine

## 2020-01-16 ENCOUNTER — Encounter: Payer: Self-pay | Admitting: Internal Medicine

## 2020-01-16 ENCOUNTER — Other Ambulatory Visit: Payer: Self-pay

## 2020-01-16 VITALS — BP 102/78 | HR 80 | Resp 12 | Ht 59.5 in | Wt 126.0 lb

## 2020-01-16 DIAGNOSIS — Z716 Tobacco abuse counseling: Secondary | ICD-10-CM

## 2020-01-16 DIAGNOSIS — R739 Hyperglycemia, unspecified: Secondary | ICD-10-CM

## 2020-01-16 DIAGNOSIS — F329 Major depressive disorder, single episode, unspecified: Secondary | ICD-10-CM

## 2020-01-16 DIAGNOSIS — F4323 Adjustment disorder with mixed anxiety and depressed mood: Secondary | ICD-10-CM

## 2020-01-16 DIAGNOSIS — F419 Anxiety disorder, unspecified: Secondary | ICD-10-CM

## 2020-01-16 DIAGNOSIS — Z720 Tobacco use: Secondary | ICD-10-CM

## 2020-01-16 DIAGNOSIS — M25512 Pain in left shoulder: Secondary | ICD-10-CM

## 2020-01-16 DIAGNOSIS — E782 Mixed hyperlipidemia: Secondary | ICD-10-CM

## 2020-01-16 DIAGNOSIS — F32A Depression, unspecified: Secondary | ICD-10-CM

## 2020-01-16 MED ORDER — SIMVASTATIN 20 MG PO TABS: 20.0000 mg | ORAL_TABLET | Freq: Every day | ORAL | 3 refills | Status: AC

## 2020-01-16 MED ORDER — NICOTINE 21 MG/24HR TD PT24: 21.0000 mg | MEDICATED_PATCH | Freq: Every day | TRANSDERMAL | 0 refills | Status: AC

## 2020-01-16 MED ORDER — NICOTINE 14 MG/24HR TD PT24: 14.0000 mg | MEDICATED_PATCH | Freq: Every day | TRANSDERMAL | 0 refills | Status: AC

## 2020-01-16 MED ORDER — NICOTINE 7 MG/24HR TD PT24: 7.0000 mg | MEDICATED_PATCH | Freq: Every day | TRANSDERMAL | 0 refills | Status: AC

## 2020-01-16 MED ORDER — TRAZODONE HCL 50 MG PO TABS: 25.0000 mg | ORAL_TABLET | Freq: Every evening | ORAL | 3 refills | Status: DC | PRN

## 2020-01-16 NOTE — Patient Instructions (Signed)
Tobacco Cessation:   1800QUITNOW or [phone], the former for support and possibly free nicotine patches/gum and support; the latter for [facility] Smoking cessation class. Get rid of all smoking supplies:  Cigarettes, lighters, ashtrays--no stashes just in case at home if you are serious.For nicotine patches:  Stop smoking anything the day you start the first patch Start with 21 mg patch and reapply new to different area of skin every 24 hours for 28 days. Then 14 mg patch changed every 24 hours for 14 days. Then 7 mg patch changed every 24 hours for 14 days.  
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
## 2020-01-17 LAB — HGB A1C W/O EAG: Hgb A1c MFr Bld: 6.3 % — ABNORMAL HIGH (ref 4.8–5.6)

## 2020-02-25 ENCOUNTER — Other Ambulatory Visit: Payer: Self-pay

## 2020-02-27 ENCOUNTER — Other Ambulatory Visit: Payer: Self-pay

## 2020-03-28 ENCOUNTER — Encounter (HOSPITAL_COMMUNITY): Payer: Self-pay

## 2020-03-28 ENCOUNTER — Other Ambulatory Visit: Payer: Self-pay

## 2020-03-28 ENCOUNTER — Ambulatory Visit (HOSPITAL_COMMUNITY)
Admission: EM | Admit: 2020-03-28 | Discharge: 2020-03-28 | Disposition: A | Discharge status: Home / Self Care | Payer: BC Managed Care – PPO | Attending: Family Medicine | Admitting: Family Medicine

## 2020-03-28 DIAGNOSIS — J309 Allergic rhinitis, unspecified: Secondary | ICD-10-CM | POA: Diagnosis not present

## 2020-03-28 MED ORDER — FLUTICASONE PROPIONATE 50 MCG/ACT NA SUSP: 1.0000 | Freq: Every day | NASAL | 2 refills | Status: AC

## 2020-03-28 NOTE — Discharge Instructions (Addendum)
I believe this is allergies Continue the Claritin daily  Flonase nasal spray daily. Follow up as needed for continued or worsening symptoms

## 2020-03-28 NOTE — ED Triage Notes (Signed)
Patient reports allergy symptoms that began yesterday. Reports she had her second covid shot on Thursday. Denies any other symptoms.

## 2020-03-29 NOTE — ED Provider Notes (Signed)
[facility]    CSN: [id number] Arrival date & time: 03/28/20  1534      History   Chief Complaint Chief Complaint  Patient presents with  . Nasal Congestion    HPI Anne Combs is a 54 y.o. female.   Patient is a 54 year old female presents today with nasal congestion and rhinorrhea.  Symptoms have been constant since yesterday.  No other associated symptoms.  Received second Covid vaccine a few days ago.  Does have a history of allergies.  Started taking Claritin today.  No fever, chills, body aches, cough, chest congestion.  ROS per HPI      Past Medical History:  Diagnosis Date  . Anxiety   . Depression   . Hyperlipidemia   . Hypertension 2006   [place]    Patient Active Problem List   Diagnosis Date Noted  . Tobacco abuse 12/15/2019  . Anxiety and depression 12/15/2019  . Severe episode of recurrent major depressive disorder, without psychotic features (HCC) 10/23/2019  . Generalized anxiety disorder 10/16/2019  . Adjustment disorder with mixed anxiety and depressed mood 10/16/2019  . Hyperlipidemia   . Hypertension 2006    Past Surgical History:  Procedure Laterality Date  . TONSILLECTOMY    . TONSILLECTOMY  1976  . TUBAL LIGATION      OB History   No obstetric history on file.      Home Medications    Prior to Admission medications   Medication Sig Start Date End Date Taking? Authorizing Provider  fluticasone (FLONASE) 50 MCG/ACT nasal spray Place 1 spray into both nostrils daily. 03/28/20   Nidya Bouyer, Gloris Manchester A, NP  nicotine (NICODERM CQ - DOSED IN MG/24 HOURS) 14 mg/24hr patch Place 1 patch (14 mg total) onto the skin daily. 01/16/20   Julieanne Manson, MD  nicotine (NICODERM CQ - DOSED IN MG/24 HOURS) 21 mg/24hr patch Place 1 patch (21 mg total) onto the skin daily. 01/16/20   Julieanne Manson, MD  nicotine (NICODERM CQ - DOSED IN MG/24 HR) 7 mg/24hr patch Place 1 patch (7 mg total) onto the skin daily. 01/16/20   Julieanne Manson, MD  simvastatin (ZOCOR) 20 MG tablet Take 1 tablet (20 mg total) by mouth at bedtime. 01/16/20   Julieanne Manson, MD  traZODone (DESYREL) 50 MG tablet Take 0.5-1 tablets (25-50 mg total) by mouth at bedtime as needed for sleep. 01/16/20   Julieanne Manson, MD    Family History History reviewed. No pertinent family history.  Social History Social History   Tobacco Use  . Smoking status: Current Every Day Smoker    Packs/day: 0.75    Types: Cigarettes  . Smokeless tobacco: Never Used  Substance Use Topics  . Alcohol use: Yes  . Drug use: Never     Allergies   Asa [aspirin] and Sulfa antibiotics   Review of Systems Review of Systems   Physical Exam Triage Vital Signs ED Triage Vitals  Enc Vitals Group     BP 03/28/20 1550 (!) 145/73     Pulse Rate 03/28/20 1550 (!) 103     Resp 03/28/20 1550 14     Temp 03/28/20 1550 98.6 F (37 C)     Temp Source 03/28/20 1550 Oral     SpO2 03/28/20 1550 99 %     Weight --      Height --      Head Circumference --      Peak Flow --      Pain  Score 03/28/20 1549 0     Pain Loc --      Pain Edu? --      Excl. in [place]? --    No data found.  Updated Vital Signs BP (!) 145/73 (BP Location: Right Arm)   Pulse (!) 103   Temp 98.6 F (37 C) (Oral)   Resp 14   SpO2 99%   Visual Acuity Right Eye Distance:   Left Eye Distance:   Bilateral Distance:    Right Eye Near:   Left Eye Near:    Bilateral Near:     Physical Exam Vitals and nursing note reviewed.  Constitutional:      General: She is not in acute distress.    Appearance: Normal appearance. She is not ill-appearing, toxic-appearing or diaphoretic.  HENT:     Head: Normocephalic.     Nose: Congestion and rhinorrhea present.     Mouth/Throat:     Pharynx: Oropharynx is clear.  Eyes:     Conjunctiva/sclera: Conjunctivae normal.  Pulmonary:     Effort: Pulmonary effort is normal.  Abdominal:     Palpations: Abdomen is soft.     Tenderness: There is  no abdominal tenderness.  Musculoskeletal:        General: Normal range of motion.     Cervical back: Normal range of motion.  Skin:    General: Skin is warm and dry.     Findings: No rash.  Neurological:     Mental Status: She is alert.  Psychiatric:        Mood and Affect: Mood normal.      UC Treatments / Results  Labs (all labs ordered are listed, but only abnormal results are displayed) Labs Reviewed - No data to display  EKG   Radiology No results found.  Procedures Procedures (including critical care time)  Medications Ordered in UC Medications - No data to display  Initial Impression / Assessment and Plan / UC Course  I have reviewed the triage vital signs and the nursing notes.  Pertinent labs & imaging results that were available during my care of the patient were reviewed by me and considered in my medical decision making (see chart for details).     Allergic rhinitis Treating with Flonase and continue Claritin daily. Follow up as needed for continued or worsening symptoms  Final Clinical Impressions(s) / UC Diagnoses   Final diagnoses:  Allergic rhinitis, unspecified seasonality, unspecified trigger     Discharge Instructions     I believe this is allergies Continue the Claritin daily  Flonase nasal spray daily. Follow up as needed for continued or worsening symptoms      ED Prescriptions    Medication Sig Dispense Auth. Provider   fluticasone (FLONASE) 50 MCG/ACT nasal spray Place 1 spray into both nostrils daily. 16 g Loura Halt A, NP     PDMP not reviewed this encounter.   Loura Halt A, NP 03/29/20 1043

## 2020-04-16 ENCOUNTER — Ambulatory Visit: Payer: Self-pay | Admitting: Internal Medicine

## 2020-05-27 ENCOUNTER — Telehealth: Payer: Self-pay | Admitting: Internal Medicine

## 2020-05-27 NOTE — Telephone Encounter (Signed)
Patient called requesting pain medication and something that can help with sleep. Patient stated has been having hands pain and has appointment with orthopedic next Tuesday. Shared information with Cherice who advised patient to take ibuprofen or tylenol 4 pills every 6 hours with food and for sleep patient was asked if has been taken traZODone (DESYREL) 50 MG tablet; patient stated never went to filled med. Patient advised to go to [facility]  and pick it up.  Later in the conversation patient shared that has [employer] as health insurance 6 months ago.  Patient informed regarding one of our policies for insured patient out of our service area with less than a year as established patient. Patient to find another PCP and advised to call us back if have any problem filling medication.  Patient verbalized understanding

## 2020-06-11 DIAGNOSIS — G4709 Other insomnia: Secondary | ICD-10-CM | POA: Diagnosis not present

## 2020-06-11 DIAGNOSIS — F418 Other specified anxiety disorders: Secondary | ICD-10-CM | POA: Diagnosis not present

## 2020-06-11 DIAGNOSIS — F17200 Nicotine dependence, unspecified, uncomplicated: Secondary | ICD-10-CM | POA: Diagnosis not present

## 2020-06-18 ENCOUNTER — Ambulatory Visit: Payer: Self-pay | Admitting: Internal Medicine

## 2020-06-22 DIAGNOSIS — F4321 Adjustment disorder with depressed mood: Secondary | ICD-10-CM | POA: Diagnosis not present

## 2020-06-22 DIAGNOSIS — G4700 Insomnia, unspecified: Secondary | ICD-10-CM | POA: Diagnosis not present

## 2020-06-22 DIAGNOSIS — F418 Other specified anxiety disorders: Secondary | ICD-10-CM | POA: Diagnosis not present

## 2020-07-08 DIAGNOSIS — G5600 Carpal tunnel syndrome, unspecified upper limb: Secondary | ICD-10-CM | POA: Diagnosis not present

## 2020-07-08 DIAGNOSIS — F4321 Adjustment disorder with depressed mood: Secondary | ICD-10-CM | POA: Diagnosis not present

## 2020-07-08 DIAGNOSIS — Z13220 Encounter for screening for lipoid disorders: Secondary | ICD-10-CM | POA: Diagnosis not present

## 2020-07-08 DIAGNOSIS — F418 Other specified anxiety disorders: Secondary | ICD-10-CM | POA: Diagnosis not present

## 2020-07-08 DIAGNOSIS — Z0000 Encounter for general adult medical examination without abnormal findings: Secondary | ICD-10-CM | POA: Diagnosis not present

## 2020-07-13 DIAGNOSIS — F438 Other reactions to severe stress: Secondary | ICD-10-CM | POA: Diagnosis not present

## 2020-07-14 DIAGNOSIS — M1811 Unilateral primary osteoarthritis of first carpometacarpal joint, right hand: Secondary | ICD-10-CM | POA: Diagnosis not present

## 2020-08-04 DIAGNOSIS — F438 Other reactions to severe stress: Secondary | ICD-10-CM | POA: Diagnosis not present

## 2020-08-05 DIAGNOSIS — M79641 Pain in right hand: Secondary | ICD-10-CM | POA: Diagnosis not present

## 2020-08-05 DIAGNOSIS — M1811 Unilateral primary osteoarthritis of first carpometacarpal joint, right hand: Secondary | ICD-10-CM | POA: Diagnosis not present

## 2020-08-26 DIAGNOSIS — M1811 Unilateral primary osteoarthritis of first carpometacarpal joint, right hand: Secondary | ICD-10-CM | POA: Diagnosis not present

## 2020-09-14 DIAGNOSIS — M1811 Unilateral primary osteoarthritis of first carpometacarpal joint, right hand: Secondary | ICD-10-CM | POA: Diagnosis not present

## 2020-09-14 DIAGNOSIS — M79644 Pain in right finger(s): Secondary | ICD-10-CM | POA: Diagnosis not present

## 2020-09-22 DIAGNOSIS — M25519 Pain in unspecified shoulder: Secondary | ICD-10-CM | POA: Diagnosis not present

## 2020-09-24 DIAGNOSIS — S4352XA Sprain of left acromioclavicular joint, initial encounter: Secondary | ICD-10-CM | POA: Diagnosis not present

## 2020-10-23 ENCOUNTER — Ambulatory Visit (INDEPENDENT_AMBULATORY_CARE_PROVIDER_SITE_OTHER): Payer: BC Managed Care – PPO

## 2020-10-23 ENCOUNTER — Ambulatory Visit (HOSPITAL_COMMUNITY)
Admission: EM | Admit: 2020-10-23 | Discharge: 2020-10-23 | Disposition: A | Discharge status: Home / Self Care | Payer: BC Managed Care – PPO | Attending: Family Medicine | Admitting: Family Medicine

## 2020-10-23 ENCOUNTER — Encounter (HOSPITAL_COMMUNITY): Payer: Self-pay | Admitting: Emergency Medicine

## 2020-10-23 DIAGNOSIS — N390 Urinary tract infection, site not specified: Secondary | ICD-10-CM

## 2020-10-23 DIAGNOSIS — N939 Abnormal uterine and vaginal bleeding, unspecified: Secondary | ICD-10-CM | POA: Insufficient documentation

## 2020-10-23 DIAGNOSIS — R319 Hematuria, unspecified: Secondary | ICD-10-CM | POA: Diagnosis not present

## 2020-10-23 LAB — POCT URINALYSIS DIPSTICK, ED / UC
Bilirubin Urine: NEGATIVE
Glucose, UA: NEGATIVE mg/dL
Ketones, ur: NEGATIVE mg/dL
Leukocytes,Ua: NEGATIVE
Nitrite: NEGATIVE
Protein, ur: NEGATIVE mg/dL
Specific Gravity, Urine: 1.01 (ref 1.005–1.030)
Urobilinogen, UA: 0.2 mg/dL (ref 0.0–1.0)
pH: 6.5 (ref 5.0–8.0)

## 2020-10-23 LAB — CBG MONITORING, ED: Glucose-Capillary: 90 mg/dL (ref 70–99)

## 2020-10-23 LAB — BASIC METABOLIC PANEL
Anion gap: 9 (ref 5–15)
BUN: 17 mg/dL (ref 6–20)
CO2: 27 mmol/L (ref 22–32)
Calcium: 9.6 mg/dL (ref 8.9–10.3)
Chloride: 105 mmol/L (ref 98–111)
Creatinine, Ser: 0.64 mg/dL (ref 0.44–1.00)
GFR, Estimated: >60 mL/min (ref >60)
Glucose, Bld: 93 mg/dL (ref 70–99)
Potassium: 4 mmol/L (ref 3.5–5.1)
Sodium: 141 mmol/L (ref 135–145)

## 2020-10-23 NOTE — ED Provider Notes (Signed)
[facility]    CSN: [id number] Arrival date & time: 10/23/20  1740      History   Chief Complaint Chief Complaint  Patient presents with  . Urinary Tract Infection    HPI Anne Combs is a 54 y.o. female.   HPI Anne Combs is a 54 y.o. female presents for evaluation of urinary frequency on-going for several weeks and one day of vaginal bleeding occurring with and without urination. Patient is post menopausal. She endorses earlier today noticing blood in her underwear and later noticing blood mix with her urine.  Medical history significant for prediabetes.  She is currently not being treated with any medications.  Denies any known history of any abnormal Pap smears although is unable to report when she has had last Pap.  Denies any abdominal pain flank pain or low back pain.  Denies nausea or vomiting. Past Medical History:  Diagnosis Date  . Anxiety   . Depression   . Hyperlipidemia   . Hypertension 2006   [place]    Patient Active Problem List   Diagnosis Date Noted  . Tobacco abuse 12/15/2019  . Anxiety and depression 12/15/2019  . Severe episode of recurrent major depressive disorder, without psychotic features (HCC) 10/23/2019  . Generalized anxiety disorder 10/16/2019  . Adjustment disorder with mixed anxiety and depressed mood 10/16/2019  . Hyperlipidemia   . Hypertension 2006    Past Surgical History:  Procedure Laterality Date  . TONSILLECTOMY    . TONSILLECTOMY  1976  . TUBAL LIGATION      OB History   No obstetric history on file.      Home Medications    Prior to Admission medications   Medication Sig Start Date End Date Taking? Authorizing Provider  fluticasone (FLONASE) 50 MCG/ACT nasal spray Place 1 spray into both nostrils daily. 03/28/20   Bast, Gloris Manchester A, NP  nicotine (NICODERM CQ - DOSED IN MG/24 HOURS) 14 mg/24hr patch Place 1 patch (14 mg total) onto the skin daily. 01/16/20   Julieanne Manson, MD  nicotine  (NICODERM CQ - DOSED IN MG/24 HOURS) 21 mg/24hr patch Place 1 patch (21 mg total) onto the skin daily. 01/16/20   Julieanne Manson, MD  nicotine (NICODERM CQ - DOSED IN MG/24 HR) 7 mg/24hr patch Place 1 patch (7 mg total) onto the skin daily. 01/16/20   Julieanne Manson, MD  simvastatin (ZOCOR) 20 MG tablet Take 1 tablet (20 mg total) by mouth at bedtime. 01/16/20   Julieanne Manson, MD  traZODone (DESYREL) 50 MG tablet Take 0.5-1 tablets (25-50 mg total) by mouth at bedtime as needed for sleep. 01/16/20   Julieanne Manson, MD    Family History History reviewed. No pertinent family history.  Social History Social History   Tobacco Use  . Smoking status: Current Every Day Smoker    Packs/day: 0.75    Types: Cigarettes  . Smokeless tobacco: Never Used  Substance Use Topics  . Alcohol use: Yes  . Drug use: Never     Allergies   Asa [aspirin] and Sulfa antibiotics   Review of Systems Review of Systems Pertinent negatives listed in HPI  Physical Exam Triage Vital Signs ED Triage Vitals  Enc Vitals Group     BP 10/23/20 1901 (!) 145/88     Pulse Rate 10/23/20 1901 91     Resp 10/23/20 1901 14     Temp 10/23/20 1901 98.1 F (36.7 C)     Temp Source 10/23/20 1901  Oral     SpO2 10/23/20 1901 98 %     Weight --      Height --      Head Circumference --      Peak Flow --      Pain Score 10/23/20 1856 0     Pain Loc --      Pain Edu? --      Excl. in [place]? --    No data found.  Updated Vital Signs BP (!) 145/88 (BP Location: Left Arm)   Pulse 91   Temp 98.1 F (36.7 C) (Oral)   Resp 14   SpO2 98%   Visual Acuity Right Eye Distance:   Left Eye Distance:   Bilateral Distance:    Right Eye Near:   Left Eye Near:    Bilateral Near:     Physical Exam Constitutional:      Appearance: She is not ill-appearing.  Cardiovascular:     Rate and Rhythm: Normal rate and regular rhythm.  Pulmonary:     Effort: Pulmonary effort is normal.     Breath sounds: Normal  breath sounds.  Skin:    Capillary Refill: Capillary refill takes less than 2 seconds.  Neurological:     General: No focal deficit present.     Mental Status: She is alert and oriented to person, place, and time.  Psychiatric:        Mood and Affect: Mood normal.      UC Treatments / Results  Labs (all labs ordered are listed, but only abnormal results are displayed) Labs Reviewed - No data to display  EKG   Radiology No results found.  Procedures Procedures (including critical care time)  Medications Ordered in UC Medications - No data to display  Initial Impression / Assessment and Plan / UC Course  I have reviewed the triage vital signs and the nursing notes.  Pertinent labs & imaging results that were available during my care of the patient were reviewed by me and considered in my medical decision making (see chart for details).   UA significant for moderate hematuria. KUB negative of any calcifications concerning for a renal stone. Patient is asymptomatic of dysuria and over flank pain. Of concern patient has noticed today spotting in the presence and absence of urination. She is 2 years postmenopausal and is unaware when she is had her last will women's exam. Advised to follow-up either with primary care provider and I provided information to follow-up with [facility] to obtain a complete well woman's exam and further work-up of source of vaginal bleeding. Final Clinical Impressions(s) / UC Diagnoses   Final diagnoses:  Hematuria, unspecified type  Vaginal bleeding     Discharge Instructions     You will be notified of any abnormal lab results. Your lab result will be available tomorrow.  I recommend scheduling a follow-up with your primary doctor to have a complete gynecological exam or you can schedule an appointment at [facility] with a gynecologist to have a well woman's exam completed to evaluate further the source of the  vaginal bleeding.  I am culturing the urine that you provided here in clinic today to rule out any bacterial growth which could be the source of blood in urine however on exam today there is no evidence of a urinary tract infection.    ED Prescriptions    None     PDMP not reviewed this encounter.  Bing Neighbors, FNP 10/23/20 2011

## 2020-10-23 NOTE — Discharge Instructions (Addendum)
You will be notified of any abnormal lab results. Your lab result will be available tomorrow.  I recommend scheduling a follow-up with your primary doctor to have a complete gynecological exam or you can schedule an appointment at [facility] with a gynecologist to have a well woman's exam completed to evaluate further the source of the vaginal bleeding.  I am culturing the urine that you provided here in clinic today to rule out any bacterial growth which could be the source of blood in urine however on exam today there is no evidence of a urinary tract infection.

## 2020-10-23 NOTE — ED Triage Notes (Signed)
Pt c/o vaginal discharge and frequent urination onset today. Pt states she has been in menopause x 3 years and has had a hysterectomy. Pt states she saw some blood when she was wiping.

## 2020-10-25 LAB — URINE CULTURE: Culture: <10000 — AB

## 2020-12-14 ENCOUNTER — Ambulatory Visit: Payer: BC Managed Care – PPO | Admitting: Obstetrics and Gynecology

## 2021-08-31 ENCOUNTER — Other Ambulatory Visit: Payer: Self-pay

## 2021-08-31 ENCOUNTER — Ambulatory Visit (HOSPITAL_COMMUNITY)
Admission: EM | Admit: 2021-08-31 | Discharge: 2021-08-31 | Disposition: A | Discharge status: Home / Self Care | Payer: BC Managed Care – PPO | Attending: Emergency Medicine | Admitting: Emergency Medicine

## 2021-08-31 ENCOUNTER — Encounter (HOSPITAL_COMMUNITY): Payer: Self-pay

## 2021-08-31 DIAGNOSIS — N3000 Acute cystitis without hematuria: Secondary | ICD-10-CM | POA: Insufficient documentation

## 2021-08-31 LAB — POCT URINALYSIS DIPSTICK, ED / UC
Glucose, UA: 250 mg/dL — AB
Ketones, ur: 15 mg/dL — AB
Nitrite: POSITIVE — AB
Protein, ur: 100 mg/dL — AB
Specific Gravity, Urine: 1.015 (ref 1.005–1.030)
Urobilinogen, UA: >=8 mg/dL (ref 0.0–1.0)
pH: 7 (ref 5.0–8.0)

## 2021-08-31 MED ORDER — NITROFURANTOIN MONOHYD MACRO 100 MG PO CAPS: 100.0000 mg | ORAL_CAPSULE | Freq: Two times a day (BID) | ORAL | 0 refills | Status: DC

## 2021-08-31 NOTE — Discharge Instructions (Addendum)
The over-the-counter medication has skewed your urinalysis results therefore I must send it to the lab to see if it grows bacteria, however you have already been started on antibiotic for treatment, if there are needs to be a change in your medication you will be notified and the antibiotic will be sent to pharmacy  Your vaginal swab is checking for gonorrhea,, chlamydia, trichomoniasis, bacterial vaginosis and yeast, if you are positive for any of these infections, you will be notified and medication will be sent to the pharmacy for you, you only have to return to clinic if you are positive for gonorrhea because treatment is an injection  Please do not have sex until lab results, if positive please do not have sex until all treatment is complete and symptoms have resolved  May follow-up in urgent care at any point if symptoms begin to worsen or persist past use medications

## 2021-08-31 NOTE — ED Triage Notes (Signed)
Pt is here with burning while urinating this started yesterday, pt has taken AZO to relieve discomfort.

## 2021-09-01 LAB — CERVICOVAGINAL ANCILLARY ONLY
Bacterial Vaginitis (gardnerella): POSITIVE — AB
Candida Glabrata: NEGATIVE
Candida Vaginitis: NEGATIVE
Chlamydia: NEGATIVE
Comment: NEGATIVE
Comment: NEGATIVE
Comment: NEGATIVE
Comment: NEGATIVE
Comment: NEGATIVE
Comment: NORMAL
Neisseria Gonorrhea: NEGATIVE
Trichomonas: NEGATIVE

## 2021-09-02 ENCOUNTER — Telehealth (HOSPITAL_COMMUNITY): Payer: Self-pay | Admitting: Emergency Medicine

## 2021-09-02 LAB — URINE CULTURE: Culture: >=100000 — AB

## 2021-09-02 MED ORDER — METRONIDAZOLE 500 MG PO TABS: 500.0000 mg | ORAL_TABLET | Freq: Two times a day (BID) | ORAL | 0 refills | Status: DC

## 2021-09-02 NOTE — ED Provider Notes (Signed)
[facility]    CSN: [id number] Arrival date & time: 08/31/21  1507      History   Chief Complaint Chief Complaint  Patient presents with   UTI    HPI Anne Combs is a 55 y.o. female.   Patient presents with dysuria and lower abdominal pressure beginning 1 day ago.  Unsure of urinary frequency because she holds bladder while at work.  Has been using Azo for comfort. denies flank pain, hematuria, urgency, discharge, itching, irritation, odor, fever, chills.  Requesting STI testing, new partner within the last 5 months.  No known exposure.  History of anxiety, depression, hyperlipidemia, hypertension.   Declined use of Spanish interpreter  Past Medical History:  Diagnosis Date   Anxiety    Depression    Hyperlipidemia    Hypertension 2006   [place]    Patient Active Problem List   Diagnosis Date Noted   Tobacco abuse 12/15/2019   Anxiety and depression 12/15/2019   Severe episode of recurrent major depressive disorder, without psychotic features (HCC) 10/23/2019   Generalized anxiety disorder 10/16/2019   Adjustment disorder with mixed anxiety and depressed mood 10/16/2019   Hyperlipidemia    Hypertension 2006    Past Surgical History:  Procedure Laterality Date   TONSILLECTOMY     TONSILLECTOMY  1976   TUBAL LIGATION      OB History   No obstetric history on file.      Home Medications    Prior to Admission medications   Medication Sig Start Date End Date Taking? Authorizing Provider  nitrofurantoin, macrocrystal-monohydrate, (MACROBID) 100 MG capsule Take 1 capsule (100 mg total) by mouth 2 (two) times daily. 08/31/21  Yes Galo Sayed R, NP  fluticasone (FLONASE) 50 MCG/ACT nasal spray Place 1 spray into both nostrils daily. 03/28/20   Bast, Gloris Manchester A, NP  nicotine (NICODERM CQ - DOSED IN MG/24 HOURS) 14 mg/24hr patch Place 1 patch (14 mg total) onto the skin daily. 01/16/20   Julieanne Manson, MD  nicotine (NICODERM CQ - DOSED IN  MG/24 HOURS) 21 mg/24hr patch Place 1 patch (21 mg total) onto the skin daily. 01/16/20   Julieanne Manson, MD  nicotine (NICODERM CQ - DOSED IN MG/24 HR) 7 mg/24hr patch Place 1 patch (7 mg total) onto the skin daily. 01/16/20   Julieanne Manson, MD  simvastatin (ZOCOR) 20 MG tablet Take 1 tablet (20 mg total) by mouth at bedtime. 01/16/20   Julieanne Manson, MD  traZODone (DESYREL) 50 MG tablet Take 0.5-1 tablets (25-50 mg total) by mouth at bedtime as needed for sleep. 01/16/20   Julieanne Manson, MD    Family History Family History  Problem Relation Age of Onset   Bone cancer Mother    Cancer Father    Diabetes Father     Social History Social History   Tobacco Use   Smoking status: Every Day    Packs/day: 0.75    Types: Cigarettes   Smokeless tobacco: Never  Substance Use Topics   Alcohol use: Yes   Drug use: Never     Allergies   Asa [aspirin] and Sulfa antibiotics   Review of Systems Review of Systems  Constitutional: Negative.   Respiratory: Negative.    Cardiovascular: Negative.   Gastrointestinal:  Positive for abdominal pain. Negative for abdominal distention, anal bleeding, blood in stool, constipation, diarrhea, nausea, rectal pain and vomiting.  Genitourinary:  Positive for dysuria. Negative for decreased urine volume, difficulty urinating, dyspareunia, enuresis, flank pain, frequency,  genital sores, hematuria, menstrual problem, pelvic pain, urgency, vaginal bleeding, vaginal discharge and vaginal pain.  Skin: Negative.   Neurological: Negative.     Physical Exam Triage Vital Signs ED Triage Vitals  Enc Vitals Group     BP 08/31/21 1545 124/83     Pulse Rate 08/31/21 1545 95     Resp 08/31/21 1545 17     Temp 08/31/21 1545 98.1 F (36.7 C)     Temp Source 08/31/21 1545 Oral     SpO2 08/31/21 1545 98 %     Weight --      Height --      Head Circumference --      Peak Flow --      Pain Score 08/31/21 1542 0     Pain Loc --      Pain Edu? --       Excl. in [place]? --    No data found.  Updated Vital Signs BP 124/83 (BP Location: Right Arm)   Pulse 95   Temp 98.1 F (36.7 C) (Oral)   Resp 17   SpO2 98%   Visual Acuity Right Eye Distance:   Left Eye Distance:   Bilateral Distance:    Right Eye Near:   Left Eye Near:    Bilateral Near:     Physical Exam Constitutional:      Appearance: Normal appearance. She is normal weight.  HENT:     Head: Normocephalic.  Eyes:     Extraocular Movements: Extraocular movements intact.  Pulmonary:     Effort: Pulmonary effort is normal.  Abdominal:     General: Abdomen is flat. Bowel sounds are normal.     Palpations: Abdomen is soft.     Tenderness: There is abdominal tenderness in the suprapubic area. There is no right CVA tenderness or left CVA tenderness.  Genitourinary:    Comments: Deferred self collected vaginal swab Skin:    General: Skin is warm and dry.  Neurological:     Mental Status: She is alert and oriented to person, place, and time. Mental status is at baseline.  Psychiatric:        Mood and Affect: Mood normal.        Behavior: Behavior normal.     UC Treatments / Results  Labs (all labs ordered are listed, but only abnormal results are displayed) Labs Reviewed  URINE CULTURE - Abnormal; Notable for the following components:      Result Value   Culture >=100,000 COLONIES/mL STAPHYLOCOCCUS SAPROPHYTICUS (*)    Organism ID, Bacteria STAPHYLOCOCCUS SAPROPHYTICUS (*)    All other components within normal limits  POCT URINALYSIS DIPSTICK, ED / UC - Abnormal; Notable for the following components:   Glucose, UA 250 (*)    Bilirubin Urine SMALL (*)    Ketones, ur 15 (*)    Hgb urine dipstick SMALL (*)    Protein, ur 100 (*)    Nitrite POSITIVE (*)    Leukocytes,Ua LARGE (*)    All other components within normal limits  CERVICOVAGINAL ANCILLARY ONLY - Abnormal; Notable for the following components:   Bacterial Vaginitis (gardnerella) Positive (*)     All other components within normal limits    EKG   Radiology No results found.  Procedures Procedures (including critical care time)  Medications Ordered in UC Medications - No data to display  Initial Impression / Assessment and Plan / UC Course  I have reviewed the triage vital signs and the nursing notes.  Pertinent labs & imaging results that were available during my care of the patient were reviewed by me and considered in my medical decision making (see chart for details).  Acute cystitis without hematuria  Urinalysis skewed by use of Azo, sent for culture, will treat prophylactically based on symptoms  1.  Macrobid 100 mg twice daily for 5 days 2.  STI screening pending, will treat per protocol, advised abstinence until labs results and/or treatment complete 3.  Follow-up with urgent care as needed Final Clinical Impressions(s) / UC Diagnoses   Final diagnoses:  Acute cystitis without hematuria     Discharge Instructions      The over-the-counter medication has skewed your urinalysis results therefore I must send it to the lab to see if it grows bacteria, however you have already been started on antibiotic for treatment, if there are needs to be a change in your medication you will be notified and the antibiotic will be sent to pharmacy  Your vaginal swab is checking for gonorrhea,, chlamydia, trichomoniasis, bacterial vaginosis and yeast, if you are positive for any of these infections, you will be notified and medication will be sent to the pharmacy for you, you only have to return to clinic if you are positive for gonorrhea because treatment is an injection  Please do not have sex until lab results, if positive please do not have sex until all treatment is complete and symptoms have resolved  May follow-up in urgent care at any point if symptoms begin to worsen or persist past use medications   ED Prescriptions     Medication Sig Dispense Auth. Provider    nitrofurantoin, macrocrystal-monohydrate, (MACROBID) 100 MG capsule Take 1 capsule (100 mg total) by mouth 2 (two) times daily. 10 capsule Valinda Hoar, NP      PDMP not reviewed this encounter.   Valinda Hoar, [place] 09/02/21 [phone]

## 2021-09-09 ENCOUNTER — Telehealth (HOSPITAL_COMMUNITY): Payer: Self-pay | Admitting: Emergency Medicine

## 2021-09-09 MED ORDER — METRONIDAZOLE 0.75 % VA GEL: 1.0000 | Freq: Every day | VAGINAL | 0 refills | Status: AC

## 2022-02-13 IMAGING — DX DG ABDOMEN 1V
1 series · 1 of 1 positions shown · non-contrast
Comparison: None.

CLINICAL DATA: Hematuria

EXAM:
ABDOMEN - 1 VIEW

[abdomen kub]
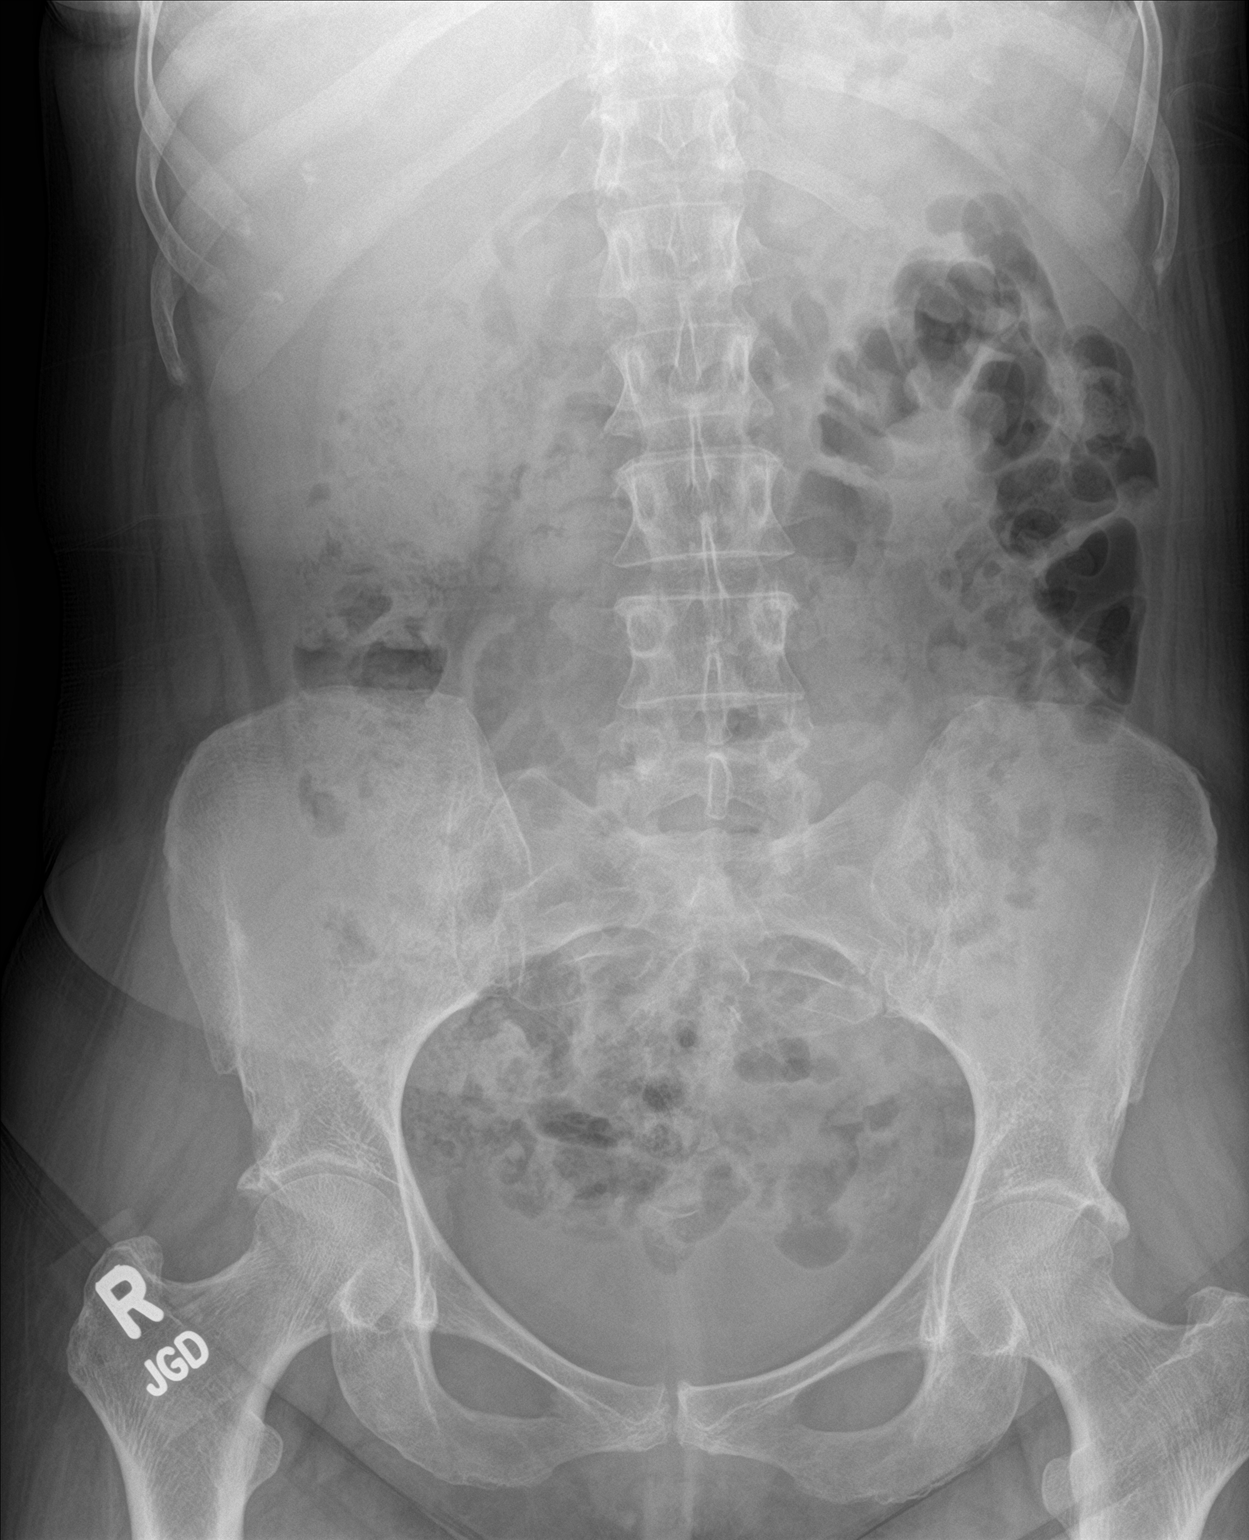

[1 of 1 positions shown; findings below may reference images not displayed]

FINDINGS: Nonobstructed gas pattern with moderate stool in the colon. No
radiopaque calculi over the abdomen or pelvis.
IMPRESSION: Negative.

## 2022-06-08 ENCOUNTER — Ambulatory Visit (HOSPITAL_COMMUNITY)
Admission: EM | Admit: 2022-06-08 | Discharge: 2022-06-08 | Disposition: A | Discharge status: Home / Self Care | Payer: Self-pay | Attending: Family Medicine | Admitting: Family Medicine

## 2022-06-08 ENCOUNTER — Encounter (HOSPITAL_COMMUNITY): Payer: Self-pay

## 2022-06-08 DIAGNOSIS — Z7721 Contact with and (suspected) exposure to potentially hazardous body fluids: Secondary | ICD-10-CM | POA: Insufficient documentation

## 2022-06-08 LAB — HEPATITIS B SURFACE ANTIGEN: Hepatitis B Surface Ag: NONREACTIVE

## 2022-06-08 LAB — RAPID HIV SCREEN (HIV 1/2 AB+AG)
HIV 1/2 Antibodies: NONREACTIVE
HIV-1 P24 Antigen - HIV24: NONREACTIVE

## 2022-06-08 NOTE — Discharge Instructions (Signed)
We will inform the dentist of your lab work.

## 2022-06-08 NOTE — ED Provider Notes (Signed)
  [facility]   [id number] 06/08/22 Arrival Time: 1355  ASSESSMENT & PLAN:  1. History of exposure to blood or body fluid    Patient is source. Sent from dentist. Pending: Orders Placed This Encounter  Procedures   Rapid HIV screen (HIV 1/2 Ab+Ag)   Hepatitis B surface antigen   HCV Ab w Reflex to Quant PCR     We will notify dentist of results. Pt gives verbal permission.   Reviewed expectations re: course of current medical issues. Questions answered. Outlined signs and symptoms indicating need for more acute intervention. Understanding verbalized. After Visit Summary given.   SUBJECTIVE: History from: Patient. Anne Combs is a 56 y.o. female. Sent from her dentist; reports assistant stuck finger during procedure. Here for source pt labs.  OBJECTIVE:  Vitals:   06/08/22 1523  BP: (!) 150/91  Pulse: 94  Resp: 18  Temp: 98.7 F (37.1 C)  TempSrc: Oral  SpO2: 97%    General appearance: alert; no distress  Labs: Labs Reviewed  RAPID HIV SCREEN (HIV 1/2 AB+AG)  HEPATITIS B SURFACE ANTIGEN  HCV AB W REFLEX TO QUANT PCR   Allergies  Allergen Reactions   Asa [Aspirin] Itching    One eye itches   Sulfa Antibiotics     Past Medical History:  Diagnosis Date   Anxiety    Depression    Hyperlipidemia    Hypertension 2006   [place]   Social History   Socioeconomic History   Marital status: Widowed    Spouse name: Not on file   Number of children: Not on file   Years of education: Not on file   Highest education level: Not on file  Occupational History   Not on file  Tobacco Use   Smoking status: Every Day    Packs/day: 0.75    Types: Cigarettes   Smokeless tobacco: Never  Substance and Sexual Activity   Alcohol use: Yes   Drug use: Never   Sexual activity: Yes    Birth control/protection: None  Other Topics Concern   Not on file  Social History Narrative   Husband died 2019-08-04 after 2 years of treatment for cancer   She  was caregiver   Lost her home and now living with her son's family--paying rent for room   Has 2 dogs, one is a 91 month [place].   Social Determinants of Health   Financial Resource Strain: Not on file  Food Insecurity: Not on file  Transportation Needs: Not on file  Physical Activity: Not on file  Stress: Not on file  Social Connections: Not on file  Intimate Partner Violence: Not on file   Family History  Problem Relation Age of Onset   Bone cancer Mother    Cancer Father    Diabetes Father    Past Surgical History:  Procedure Laterality Date   TONSILLECTOMY     TONSILLECTOMY  1976   TUBAL LIGATION       Mardella Layman, MD 06/08/22 [phone]

## 2022-06-08 NOTE — ED Triage Notes (Signed)
Pt states at the dentist and the assistant poked her finger. Pt has content form with the labs that needs to be drawn per Dr. Leonides Grills.

## 2022-06-09 ENCOUNTER — Telehealth (HOSPITAL_COMMUNITY): Payer: Self-pay | Admitting: Emergency Medicine

## 2022-06-09 LAB — HCV INTERPRETATION

## 2022-06-09 LAB — HCV AB W REFLEX TO QUANT PCR: HCV Ab: NONREACTIVE

## 2022-06-09 NOTE — Telephone Encounter (Signed)
A1 dental notified of results from source patient, verbal permission granted at visit (per provider note).

## 2023-04-10 ENCOUNTER — Other Ambulatory Visit: Payer: Self-pay

## 2023-04-10 ENCOUNTER — Ambulatory Visit (HOSPITAL_COMMUNITY)
Admission: EM | Admit: 2023-04-10 | Discharge: 2023-04-10 | Disposition: A | Discharge status: Home / Self Care | Payer: PRIVATE HEALTH INSURANCE | Attending: Family Medicine | Admitting: Family Medicine

## 2023-04-10 ENCOUNTER — Encounter (HOSPITAL_COMMUNITY): Payer: Self-pay | Admitting: *Deleted

## 2023-04-10 DIAGNOSIS — J069 Acute upper respiratory infection, unspecified: Secondary | ICD-10-CM | POA: Insufficient documentation

## 2023-04-10 DIAGNOSIS — Z1152 Encounter for screening for COVID-19: Secondary | ICD-10-CM | POA: Insufficient documentation

## 2023-04-10 LAB — SARS CORONAVIRUS 2 (TAT 6-24 HRS): SARS Coronavirus 2: NEGATIVE

## 2023-04-10 NOTE — Discharge Instructions (Signed)
You can take tylenol 500 mg-- 2 tabs every 6 hours as needed for pain or fever.  (2 tabletas por la boca cada 6 horas cuando tiene dolor o fiebre)  Also, Mucinex D can help the congestion  (mucinex D puede ayudar la nariz tapada)    You have been swabbed for COVID, and the test will result in the next 24 hours. Our staff will call you if positive. If the COVID test is positive, you should quarantine until you are fever free for 24 hours and you are starting to feel better, and then take added precautions for the next 5 days, such as physical distancing/wearing a mask and good hand hygiene/washing. (Hemos hecho una prueba de COVID. Si es positiva, las enfermeras le hablarian a decirle. Si es positiva, Ud debe hacer cuarentena por 5 dias del primer dia que Ud tuvo las sintomas)

## 2023-04-10 NOTE — ED Provider Notes (Addendum)
[facility]    CSN: [id number] Arrival date & time: 04/10/23  4098      History   Chief Complaint Chief Complaint  Patient presents with   Nasal Congestion   Fever   Eye Drainage    HPI Anne Combs is a 57 y.o. female.    Fever  Here for nasal congestion and headache that began yesterday evening.  Not much itching.  She is not had any cough to speak of and she is not short of breath.  She did have some possible subjective fever last night.  No known exposures  Past Medical History:  Diagnosis Date   Anxiety    Depression    Hyperlipidemia    Hypertension 2006   [place]    Patient Active Problem List   Diagnosis Date Noted   Tobacco abuse 12/15/2019   Anxiety and depression 12/15/2019   Severe episode of recurrent major depressive disorder, without psychotic features (HCC) 10/23/2019   Generalized anxiety disorder 10/16/2019   Adjustment disorder with mixed anxiety and depressed mood 10/16/2019   Hyperlipidemia    Hypertension 2006    Past Surgical History:  Procedure Laterality Date   TONSILLECTOMY     TONSILLECTOMY  1976   TUBAL LIGATION      OB History   No obstetric history on file.      Home Medications    Prior to Admission medications   Medication Sig Start Date End Date Taking? Authorizing Provider  fluticasone (FLONASE) 50 MCG/ACT nasal spray Place 1 spray into both nostrils daily. 03/28/20   Bast, Gloris Manchester A, NP  nicotine (NICODERM CQ - DOSED IN MG/24 HOURS) 14 mg/24hr patch Place 1 patch (14 mg total) onto the skin daily. 01/16/20   Julieanne Manson, MD  nicotine (NICODERM CQ - DOSED IN MG/24 HOURS) 21 mg/24hr patch Place 1 patch (21 mg total) onto the skin daily. 01/16/20   Julieanne Manson, MD  nicotine (NICODERM CQ - DOSED IN MG/24 HR) 7 mg/24hr patch Place 1 patch (7 mg total) onto the skin daily. 01/16/20   Julieanne Manson, MD  simvastatin (ZOCOR) 20 MG tablet Take 1 tablet (20 mg total) by mouth at bedtime.  01/16/20   Julieanne Manson, MD  traZODone (DESYREL) 50 MG tablet Take 0.5-1 tablets (25-50 mg total) by mouth at bedtime as needed for sleep. 01/16/20   Julieanne Manson, MD    Family History Family History  Problem Relation Age of Onset   Bone cancer Mother    Cancer Father    Diabetes Father     Social History Social History   Tobacco Use   Smoking status: Every Day    Packs/day: .75    Types: Cigarettes   Smokeless tobacco: Never  Substance Use Topics   Alcohol use: Yes   Drug use: Never     Allergies   Asa [aspirin] and Sulfa antibiotics   Review of Systems Review of Systems  Constitutional:  Positive for fever.     Physical Exam Triage Vital Signs ED Triage Vitals  Enc Vitals Group     BP 04/10/23 0835 139/87     Pulse Rate 04/10/23 0835 99     Resp 04/10/23 0835 18     Temp 04/10/23 0835 98.5 F (36.9 C)     Temp src --      SpO2 04/10/23 0835 96 %     Weight --      Height --      Head Circumference --  Peak Flow --      Pain Score 04/10/23 0834 0     Pain Loc --      Pain Edu? --      Excl. in GC? --    No data found.  Updated Vital Signs BP 139/87   Pulse 99   Temp 98.5 F (36.9 C)   Resp 18   SpO2 96%   Visual Acuity Right Eye Distance:   Left Eye Distance:   Bilateral Distance:    Right Eye Near:   Left Eye Near:    Bilateral Near:     Physical Exam Vitals reviewed.  Constitutional:      General: She is not in acute distress.    Appearance: She is not ill-appearing, toxic-appearing or diaphoretic.  HENT:     Right Ear: Tympanic membrane and ear canal normal.     Left Ear: Tympanic membrane and ear canal normal.     Nose: Congestion present.     Mouth/Throat:     Mouth: Mucous membranes are moist.     Pharynx: No oropharyngeal exudate or posterior oropharyngeal erythema.  Eyes:     Extraocular Movements: Extraocular movements intact.     Conjunctiva/sclera: Conjunctivae normal.     Pupils: Pupils are equal,  round, and reactive to light.  Cardiovascular:     Rate and Rhythm: Normal rate and regular rhythm.     Heart sounds: No murmur heard. Pulmonary:     Effort: Pulmonary effort is normal. No respiratory distress.     Breath sounds: No stridor. No wheezing, rhonchi or rales.  Musculoskeletal:     Cervical back: Neck supple.  Lymphadenopathy:     Cervical: No cervical adenopathy.  Skin:    Capillary Refill: Capillary refill takes less than 2 seconds.     Coloration: Skin is not jaundiced or pale.  Neurological:     General: No focal deficit present.     Mental Status: She is alert and oriented to person, place, and time.  Psychiatric:        Behavior: Behavior normal.      UC Treatments / Results  Labs (all labs ordered are listed, but only abnormal results are displayed) Labs Reviewed  SARS CORONAVIRUS 2 (TAT 6-24 HRS)    EKG   Radiology No results found.  Procedures Procedures (including critical care time)  Medications Ordered in UC Medications - No data to display  Initial Impression / Assessment and Plan / UC Course  I have reviewed the triage vital signs and the nursing notes.  Pertinent labs & imaging results that were available during my care of the patient were reviewed by me and considered in my medical decision making (see chart for details).        Recommendations are made for her to take Tylenol as needed and Mucinex D.  Also I will mention Zyrtec to her if she ends up thinking this is more allergies than an infection.  COVID swab is done today and if positive, she is a candidate for Paxlovid as she is a smoker.  Her last EGFR was greater than 60 Final Clinical Impressions(s) / UC Diagnoses   Final diagnoses:  Viral upper respiratory tract infection     Discharge Instructions      You can take tylenol 500 mg-- 2 tabs every 6 hours as needed for pain or fever.  (2 tabletas por la boca cada 6 horas cuando tiene dolor o fiebre)  Also, Mucinex D  can  help the congestion  (mucinex D puede ayudar la nariz tapada)    You have been swabbed for COVID, and the test will result in the next 24 hours. Our staff will call you if positive. If the COVID test is positive, you should quarantine until you are fever free for 24 hours and you are starting to feel better, and then take added precautions for the next 5 days, such as physical distancing/wearing a mask and good hand hygiene/washing. (Hemos hecho una prueba de COVID. Si es positiva, las enfermeras le hablarian a decirle. Si es positiva, Ud debe hacer cuarentena por 5 dias del primer dia que Ud tuvo las sintomas)      ED Prescriptions   None    PDMP not reviewed this encounter.   Zenia Resides, MD 04/10/23 1610    Zenia Resides, MD 04/10/23 [phone]

## 2023-04-10 NOTE — ED Triage Notes (Signed)
Pt reports since yesterday she has had a runny nose,eye drainage,congestion and sneezing PT will need a work note

## 2024-01-06 ENCOUNTER — Ambulatory Visit (INDEPENDENT_AMBULATORY_CARE_PROVIDER_SITE_OTHER): Payer: No Typology Code available for payment source

## 2024-01-06 ENCOUNTER — Ambulatory Visit (HOSPITAL_COMMUNITY)
Admission: EM | Admit: 2024-01-06 | Discharge: 2024-01-06 | Disposition: A | Discharge status: Home / Self Care | Payer: No Typology Code available for payment source | Attending: Family Medicine | Admitting: Family Medicine

## 2024-01-06 ENCOUNTER — Encounter (HOSPITAL_COMMUNITY): Payer: Self-pay

## 2024-01-06 DIAGNOSIS — M25561 Pain in right knee: Secondary | ICD-10-CM

## 2024-01-06 MED ORDER — NAPROXEN 500 MG PO TABS: 500.0000 mg | ORAL_TABLET | Freq: Two times a day (BID) | ORAL | 0 refills | Status: AC | PRN

## 2024-01-06 NOTE — ED Provider Notes (Signed)
[facility]    CSN: [id number] Arrival date & time: 01/06/24  1603      History   Chief Complaint Chief Complaint  Patient presents with   Knee Pain    HPI Anne Combs is a 58 y.o. female.    Knee Pain Here for right knee pain.  This afternoon she tripped on her sandals that were on the floor and when this happened her right knee twisted.  She did not fall onto the knee but instead fell back.  She now has pain on the medial aspect of the patella and swelling there.    She is allergic to aspirin which causes some itching and sulfa causes rash and hives   Past Medical History:  Diagnosis Date   Anxiety    Depression    Hyperlipidemia    Hypertension 2006   [place]    Patient Active Problem List   Diagnosis Date Noted   Tobacco abuse 12/15/2019   Anxiety and depression 12/15/2019   Severe episode of recurrent major depressive disorder, without psychotic features (HCC) 10/23/2019   Generalized anxiety disorder 10/16/2019   Adjustment disorder with mixed anxiety and depressed mood 10/16/2019   Hyperlipidemia    Hypertension 2006    Past Surgical History:  Procedure Laterality Date   TONSILLECTOMY     TONSILLECTOMY  1976   TUBAL LIGATION      OB History   No obstetric history on file.      Home Medications    Prior to Admission medications   Medication Sig Start Date End Date Taking? Authorizing Provider  fluticasone (FLONASE) 50 MCG/ACT nasal spray Place 1 spray into both nostrils daily. 03/28/20  Yes Bast, Traci A, FNP  naproxen (NAPROSYN) 500 MG tablet Take 1 tablet (500 mg total) by mouth 2 (two) times daily as needed (pain). 01/06/24  Yes Zenia Resides, MD  simvastatin (ZOCOR) 20 MG tablet Take 1 tablet (20 mg total) by mouth at bedtime. 01/16/20  Yes Julieanne Manson, MD  traZODone (DESYREL) 50 MG tablet Take 0.5-1 tablets (25-50 mg total) by mouth at bedtime as needed for sleep. 01/16/20  Yes Julieanne Manson, MD   nicotine (NICODERM CQ - DOSED IN MG/24 HOURS) 14 mg/24hr patch Place 1 patch (14 mg total) onto the skin daily. 01/16/20   Julieanne Manson, MD  nicotine (NICODERM CQ - DOSED IN MG/24 HOURS) 21 mg/24hr patch Place 1 patch (21 mg total) onto the skin daily. 01/16/20   Julieanne Manson, MD  nicotine (NICODERM CQ - DOSED IN MG/24 HR) 7 mg/24hr patch Place 1 patch (7 mg total) onto the skin daily. 01/16/20   Julieanne Manson, MD    Family History Family History  Problem Relation Age of Onset   Bone cancer Mother    Cancer Father    Diabetes Father     Social History Social History   Tobacco Use   Smoking status: Every Day    Current packs/day: 0.75    Types: Cigarettes   Smokeless tobacco: Never  Substance Use Topics   Alcohol use: Yes   Drug use: Never     Allergies   Asa [aspirin], Sulfa antibiotics, and Sulfamethoxazole   Review of Systems Review of Systems   Physical Exam Triage Vital Signs ED Triage Vitals  Encounter Vitals Group     BP 01/06/24 1801 115/82     Systolic BP Percentile --      Diastolic BP Percentile --      Pulse  Rate 01/06/24 1801 (!) 113     Resp 01/06/24 1801 18     Temp 01/06/24 1801 98.8 F (37.1 C)     Temp Source 01/06/24 1801 Oral     SpO2 01/06/24 1801 98 %     Weight --      Height --      Head Circumference --      Peak Flow --      Pain Score 01/06/24 1802 6     Pain Loc --      Pain Education --      Exclude from Growth Chart --    No data found.  Updated Vital Signs BP 115/82 (BP Location: Left Arm)   Pulse (!) 113   Temp 98.8 F (37.1 C) (Oral)   Resp 18   SpO2 98%   Visual Acuity Right Eye Distance:   Left Eye Distance:   Bilateral Distance:    Right Eye Near:   Left Eye Near:    Bilateral Near:     Physical Exam   UC Treatments / Results  Labs (all labs ordered are listed, but only abnormal results are displayed) Labs Reviewed - No data to display  EKG   Radiology DG Knee AP/LAT W/Sunrise  Right Result Date: 01/06/2024 CLINICAL DATA:  Acute pain of right knee. right knee pain over medial aspect. Tripped and right knee twisted this afternoon EXAM: RIGHT KNEE 3 VIEWS COMPARISON:  None Available. FINDINGS: No acute fracture or dislocation. There is minor patellofemoral spurring. Small to moderate knee joint effusion. No erosive change or focal bone abnormality. Mild prepatellar soft tissue edema. IMPRESSION: 1. No fracture or dislocation of the right knee. 2. Small to moderate knee joint effusion. Electronically Signed   By: Narda Rutherford M.D.   On: 01/06/2024 19:19    Procedures Procedures (including critical care time)  Medications Ordered in UC Medications - No data to display  Initial Impression / Assessment and Plan / UC Course  I have reviewed the triage vital signs and the nursing notes.  Pertinent labs & imaging results that were available during my care of the patient were reviewed by me and considered in my medical decision making (see chart for details).     By my review there is no fracture, there is some roughness to the anterior portion of her patella.  She is advised of radiology overread.  I was going to give her Toradol but she is allergic to aspirin so that was not done.  Naproxen is sent and sent to the pharmacy.  Ace bandage was applied.  She is given contact information for orthopedics.  Before this note was completed but after patient discharged the radiology reading did show that there was no fracture Final Clinical Impressions(s) / UC Diagnoses   Final diagnoses:  Acute pain of right knee     Discharge Instructions      The x-ray does not show any fractures or broken bones by my review.  The radiologist will also read your x-ray, and if their interpretation differs significantly from mine, we will call you.   Take naproxen 500 mg--1 tablet every 12 hours as needed for pain  Ice your knee tonight     ED Prescriptions     Medication Sig  Dispense Auth. Provider   naproxen (NAPROSYN) 500 MG tablet Take 1 tablet (500 mg total) by mouth 2 (two) times daily as needed (pain). 30 tablet Marlinda Mike, Janace Aris, MD  PDMP not reviewed this encounter.   Zenia Resides, MD 01/06/24 [phone]

## 2024-01-06 NOTE — ED Triage Notes (Signed)
Patient presents to the office for right knee pain after a slip and fall today. Patient states her knee twisted.

## 2024-01-06 NOTE — Discharge Instructions (Signed)
The x-ray does not show any fractures or broken bones by my review.  The radiologist will also read your x-ray, and if their interpretation differs significantly from mine, we will call you.   Take naproxen 500 mg--1 tablet every 12 hours as needed for pain  Ice your knee tonight

## 2024-01-11 ENCOUNTER — Ambulatory Visit
Admission: EM | Admit: 2024-01-11 | Discharge: 2024-01-11 | Disposition: A | Discharge status: Home / Self Care | Payer: No Typology Code available for payment source | Attending: Family Medicine | Admitting: Family Medicine

## 2024-01-11 DIAGNOSIS — M25561 Pain in right knee: Secondary | ICD-10-CM

## 2024-01-11 MED ORDER — HYDROCODONE-ACETAMINOPHEN 5-325 MG PO TABS: 0.5000 | ORAL_TABLET | Freq: Four times a day (QID) | ORAL | 0 refills | Status: AC | PRN

## 2024-01-11 NOTE — Discharge Instructions (Signed)
Be aware, you have been prescribed pain medications that may cause drowsiness. While taking this medication, do not take any other medications containing acetaminophen (Tylenol). Do not combine with alcohol or recreational drugs. Please do not drive, operate heavy machinery, or take part in activities that require making important decisions while on this medication as your judgement may be clouded.

## 2024-01-11 NOTE — ED Provider Notes (Signed)
[facility]   [id number] 01/11/24 Arrival Time: 0911  ASSESSMENT & PLAN:  1. Acute pain of right knee    I have personally viewed and independently interpreted the imaging studies ordered on 01/06/2024. R knee: no acute bony abnormalities appreciated.  As needed: New Prescriptions   HYDROCODONE-ACETAMINOPHEN (NORCO/VICODIN) 5-325 MG TABLET    Take 0.5-1 tablets by mouth every 6 (six) hours as needed for severe pain (pain score 7-10).    Orders Placed This Encounter  Procedures   Apply knee immobilizer   Work/school excuse note: provided. Recommend:  Follow-up Information     Go to  [facility]   Specialty: Specialist Why: Their walk-in clinic. Contact information: [address] [place] [address] [phone]                 [place] Controlled Substances Registry consulted for this patient. I feel the risk/benefit ratio today is favorable for proceeding with this prescription for a controlled substance. Medication sedation precautions given.  Reviewed expectations re: course of current medical issues. Questions answered. Outlined signs and symptoms indicating need for more acute intervention. Patient verbalized understanding. After Visit Summary given.  SUBJECTIVE: History from: patient and family. Anne Combs Anne Combs is a 58 y.o. female who reports continued medial R knee pain s/p twisting injury; note from 01/06/24 reviewed by me. OTC knee brace without much help. Reports she is hesitant to take ibuprofen secondary to reported ASA allergy. Ambulatory with cane. No extremity sensation changes or weakness.   Past Surgical History:  Procedure Laterality Date   TONSILLECTOMY     TONSILLECTOMY  1976   TUBAL LIGATION        OBJECTIVE:  Vitals:   01/11/24 0956  BP: 127/80  Pulse: 87  Resp: 17  Temp: 98.3 F (36.8 C)  TempSrc: Oral  SpO2: 97%    General appearance: alert; no distress HEENT: [place]; AT Neck: supple with  FROM Resp: unlabored respirations Extremities: RLE: warm with well perfused appearance; fairly well localized moderate tenderness over right medial knee with small effusion; without gross deformities; swelling: minimal; bruising: none; knee ROM: limited by reported pain CV: brisk extremity capillary refill of RLE; 2+ DP pulse of RLE. Skin: warm and dry; no visible rashes Neurologic: normal sensation and strength of RLE Psychological: alert and cooperative; normal mood and affect  Imaging: DG Knee AP/LAT W/Sunrise Right Result Date: 01/06/2024 CLINICAL DATA:  Acute pain of right knee. right knee pain over medial aspect. Tripped and right knee twisted this afternoon EXAM: RIGHT KNEE 3 VIEWS COMPARISON:  None Available. FINDINGS: No acute fracture or dislocation. There is minor patellofemoral spurring. Small to moderate knee joint effusion. No erosive change or focal bone abnormality. Mild prepatellar soft tissue edema. IMPRESSION: 1. No fracture or dislocation of the right knee. 2. Small to moderate knee joint effusion. Electronically Signed   By: Narda Rutherford M.D.   On: 01/06/2024 19:19       Allergies  Allergen Reactions   Asa [Aspirin] Itching    One eye itches   Sulfa Antibiotics    Sulfamethoxazole Hives    Past Medical History:  Diagnosis Date   Anxiety    Depression    Hyperlipidemia    Hypertension 2006   [place]   Social History   Socioeconomic History   Marital status: Widowed    Spouse name: Not on file   Number of children: Not on file   Years of education: Not on file  Highest education level: Not on file  Occupational History   Not on file  Tobacco Use   Smoking status: Every Day    Current packs/day: 0.75    Types: Cigarettes   Smokeless tobacco: Never  Substance and Sexual Activity   Alcohol use: Yes   Drug use: Never   Sexual activity: Yes    Birth control/protection: None  Other Topics Concern   Not on file  Social History Narrative    Husband died 07/25/2019 after 2 years of treatment for cancer   She was caregiver   Lost her home and now living with her son's family--paying rent for room   Has 2 dogs, one is a 25 month [place].   Social Drivers of Corporate investment banker Strain: Not on file  Food Insecurity: Not on file  Transportation Needs: Unknown (10/02/2019)   PRAPARE - Administrator, Civil Service (Medical): No    Lack of Transportation (Non-Medical): Not on file  Physical Activity: Not on file  Stress: Not on file  Social Connections: Not on file   Family History  Problem Relation Age of Onset   Bone cancer Mother    Cancer Father    Diabetes Father    Past Surgical History:  Procedure Laterality Date   TONSILLECTOMY     TONSILLECTOMY  1976   TUBAL LIGATION         Mardella Layman, MD 01/11/24 1156

## 2024-01-11 NOTE — ED Triage Notes (Signed)
Pt presents with c/o rt knee pain, states she was seen recently and was told there was not a fracture or broken bone. States she has not contacted [facility]. States she wants something for the pain. Pt is allergic to Aspirin.

## 2024-01-12 ENCOUNTER — Encounter (HOSPITAL_BASED_OUTPATIENT_CLINIC_OR_DEPARTMENT_OTHER): Payer: Self-pay | Admitting: Student

## 2024-01-12 ENCOUNTER — Ambulatory Visit (HOSPITAL_BASED_OUTPATIENT_CLINIC_OR_DEPARTMENT_OTHER): Payer: No Typology Code available for payment source | Admitting: Student

## 2024-01-12 DIAGNOSIS — M25561 Pain in right knee: Secondary | ICD-10-CM | POA: Diagnosis not present

## 2024-01-12 NOTE — Progress Notes (Signed)
Chief Complaint: Right knee injury     History of Present Illness:    Anne Combs is a 58 y.o. female presenting today for evaluation of a right knee injury.  She reports having a twisting injury 6 days ago while walking and wearing sandals.  This caused her to fall onto the ground but denies hitting her head.  Since the injury she reports continued swelling as well as severe pain.  This is made worse with weightbearing.  Has had another recurrence of her knee buckling within the last few days.  She is using a cane for ambulation and is in a knee immobilizer.  Does have a prescription for Norco 5-325 from the ED but has not been taking this.   Surgical History:   None  PMH/PSH/Family History/Social History/Meds/Allergies:    Past Medical History:  Diagnosis Date   Anxiety    Depression    Hyperlipidemia    Hypertension 2006   [place]   Past Surgical History:  Procedure Laterality Date   TONSILLECTOMY     TONSILLECTOMY  1976   TUBAL LIGATION     Social History   Socioeconomic History   Marital status: Widowed    Spouse name: Not on file   Number of children: Not on file   Years of education: Not on file   Highest education level: Not on file  Occupational History   Not on file  Tobacco Use   Smoking status: Every Day    Current packs/day: 0.75    Types: Cigarettes   Smokeless tobacco: Never  Substance and Sexual Activity   Alcohol use: Yes   Drug use: Never   Sexual activity: Yes    Birth control/protection: None  Other Topics Concern   Not on file  Social History Narrative   Husband died Aug 18, 2019 after 2 years of treatment for cancer   She was caregiver   Lost her home and now living with her son's family--paying rent for room   Has 2 dogs, one is a 19 month [place].   Social Drivers of Corporate investment banker Strain: Not on file  Food Insecurity: Not on file  Transportation Needs: Unknown  (10/02/2019)   PRAPARE - Administrator, Civil Service (Medical): No    Lack of Transportation (Non-Medical): Not on file  Physical Activity: Not on file  Stress: Not on file  Social Connections: Not on file   Family History  Problem Relation Age of Onset   Bone cancer Mother    Cancer Father    Diabetes Father    Allergies  Allergen Reactions   Asa [Aspirin] Itching    One eye itches   Sulfa Antibiotics    Sulfamethoxazole Hives   Current Outpatient Medications  Medication Sig Dispense Refill   fluticasone (FLONASE) 50 MCG/ACT nasal spray Place 1 spray into both nostrils daily. 16 g 2   HYDROcodone-acetaminophen (NORCO/VICODIN) 5-325 MG tablet Take 0.5-1 tablets by mouth every 6 (six) hours as needed for severe pain (pain score 7-10). 10 tablet 0   naproxen (NAPROSYN) 500 MG tablet Take 1 tablet (500 mg total) by mouth 2 (two) times daily as needed (pain). 30 tablet 0   nicotine (NICODERM CQ - DOSED IN MG/24 HOURS) 14 mg/24hr patch Place 1 patch (14 mg  total) onto the skin daily. 14 patch 0   nicotine (NICODERM CQ - DOSED IN MG/24 HOURS) 21 mg/24hr patch Place 1 patch (21 mg total) onto the skin daily. 28 patch 0   nicotine (NICODERM CQ - DOSED IN MG/24 HR) 7 mg/24hr patch Place 1 patch (7 mg total) onto the skin daily. 14 patch 0   simvastatin (ZOCOR) 20 MG tablet Take 1 tablet (20 mg total) by mouth at bedtime. 90 tablet 3   traZODone (DESYREL) 50 MG tablet Take 0.5-1 tablets (25-50 mg total) by mouth at bedtime as needed for sleep. 30 tablet 3   No current facility-administered medications for this visit.   No results found.  Review of Systems:   A ROS was performed including pertinent positives and negatives as documented in the HPI.  Physical Exam :   Constitutional: NAD and appears stated age Neurological: Alert and oriented Psych: Appropriate affect and cooperative There were no vitals taken for this visit.   Comprehensive Musculoskeletal Exam:     Right knee exam demonstrates presence of a moderate effusion with surrounding soft tissue edema.  No obvious deformity noted.  Tenderness to palpation over the patella and medial joint line.  Active range of motion limited from 10 to 30 degrees due to pain.  No laxity noted with varus or valgus stress.  Imaging:   Xray review from 01/06/2024 (right knee 3 views): Moderate effusion present but no evidence of bony abnormality   I personally reviewed and interpreted the radiographs.   Assessment:   58 y.o. female with acute right knee pain after a twisting injury almost 1 week ago.  X-rays do not show any evidence of acute abnormality or significant degenerative changes.  Given her continued instability, effusion, and difficulty weightbearing I do have concern for an underlying ligament or meniscal injury.  Unable to perform special testing due to difficulty with range of motion.  I will order a stat MRI today for further evaluation of this.  I took her out of the immobilizer and placed in a soft hinge brace which she tolerated better.  Can weight bear as tolerated.  Plan :    -Obtain stat MRI of the right knee and return to clinic for review and treatment discussion     I personally saw and evaluated the patient, and participated in the management and treatment plan.  Hazle Nordmann, PA-C Orthopedics

## 2024-01-17 ENCOUNTER — Telehealth: Payer: Self-pay | Admitting: Student

## 2024-01-17 NOTE — Telephone Encounter (Signed)
 Called patient to let her know that she will get a call to schedule the MRI. 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

## 2024-01-17 NOTE — Telephone Encounter (Signed)
 Patient would like a note to go back to work on Monday. She now has her insurance straight. Can have the MRI. Her cb# [phone] would like the letter emailed [email or web address]

## 2024-02-04 ENCOUNTER — Other Ambulatory Visit: Payer: No Typology Code available for payment source

## 2024-02-05 ENCOUNTER — Telehealth (HOSPITAL_BASED_OUTPATIENT_CLINIC_OR_DEPARTMENT_OTHER): Payer: Self-pay | Admitting: Student

## 2024-02-05 NOTE — Telephone Encounter (Signed)
 Patient will call back to sch Mri review

## 2024-02-09 ENCOUNTER — Telehealth: Payer: Self-pay | Admitting: Student

## 2024-02-09 ENCOUNTER — Ambulatory Visit (HOSPITAL_BASED_OUTPATIENT_CLINIC_OR_DEPARTMENT_OTHER): Payer: No Typology Code available for payment source

## 2024-02-09 NOTE — Telephone Encounter (Signed)
 Patient called advised she was told she had to pay $450.00 before the MRI. Patient also said she need a letter stating she can walk for her employer. Patient asked for a call back as soon as possible. The number to contact patient is [phone]

## 2024-02-12 ENCOUNTER — Telehealth (HOSPITAL_BASED_OUTPATIENT_CLINIC_OR_DEPARTMENT_OTHER): Payer: Self-pay | Admitting: Student

## 2024-02-12 NOTE — Telephone Encounter (Signed)
 Patient needs a handicap plaqued because she can not walk 3 block to work

## 2024-02-22 ENCOUNTER — Ambulatory Visit: Admitting: Physician Assistant

## 2024-02-22 ENCOUNTER — Encounter: Payer: Self-pay | Admitting: Physician Assistant

## 2024-02-22 VITALS — BP 129/83 | HR 100 | Ht 60.0 in | Wt 125.0 lb

## 2024-02-22 DIAGNOSIS — R7303 Prediabetes: Secondary | ICD-10-CM | POA: Diagnosis not present

## 2024-02-22 DIAGNOSIS — N951 Menopausal and female climacteric states: Secondary | ICD-10-CM | POA: Diagnosis not present

## 2024-02-22 DIAGNOSIS — F5104 Psychophysiologic insomnia: Secondary | ICD-10-CM

## 2024-02-22 DIAGNOSIS — Z1231 Encounter for screening mammogram for malignant neoplasm of breast: Secondary | ICD-10-CM

## 2024-02-22 LAB — POCT GLYCOSYLATED HEMOGLOBIN (HGB A1C): Hemoglobin A1C: 6.1 % — AB (ref 4.0–5.6)

## 2024-02-22 MED ORDER — TRAZODONE HCL 50 MG PO TABS: 25.0000 mg | ORAL_TABLET | Freq: Every evening | ORAL | 1 refills | Status: AC | PRN

## 2024-02-22 NOTE — Patient Instructions (Addendum)
 VISIT SUMMARY:  During today's visit, we discussed your ongoing symptoms of hot flashes, mood swings, irritability, and sleep disturbances, which have been affecting you for the past three months. We also reviewed your hydration habits and pre-diabetes management.  YOUR PLAN:  -MENOPAUSAL SYMPTOMS: Menopausal symptoms are due to the decline in estrogen levels following the cessation of your menstrual cycle. To help manage these symptoms, it is important to stay hydrated by drinking four bottles of water daily, which you can flavor if needed. We have prescribed trazodone to help improve your sleep, and you should take it at bedtime, adjusting the dose as necessary. Additionally, reducing caffeine intake and eliminating TV at bedtime may help. If symptoms persist, we can discuss the option of starting Paxil. We will also conduct lab work to check your thyroid, kidney, and liver function.  -DEHYDRATION: Dehydration occurs when your body does not have enough water, which can contribute to anxiety and irritability. To address this, you should aim to drink four bottles of water daily, and you can flavor the water if needed.  -PREDIABETES: Prediabetes is a condition where your blood sugar levels are higher than normal but not high enough to be classified as diabetes. Your A1c level is 6.1, indicating prediabetes. It is important to manage your diet and improve your sleep. You should eliminate sweetened beverages like sweetened green tea and replace them with water or sugar-free alternatives. We will check your blood sugar levels today and recheck your A1c in three months.  -GENERAL HEALTH MAINTENANCE: You are due for a mammogram, which is an important regular screening to detect any early signs of breast cancer. We have ordered a mammogram, they will call you to schedule.  Insomnia Insomnia is a sleep disorder that makes it difficult to fall asleep or stay asleep. Insomnia can cause fatigue, low energy,  difficulty concentrating, mood swings, and poor performance at work or school. There are three different ways to classify insomnia: Difficulty falling asleep. Difficulty staying asleep. Waking up too early in the morning. Any type of insomnia can be long-term (chronic) or short-term (acute). Both are common. Short-term insomnia usually lasts for 3 months or less. Chronic insomnia occurs at least three times a week for longer than 3 months. What are the causes? Insomnia may be caused by another condition, situation, or substance, such as: Having certain mental health conditions, such as anxiety and depression. Using caffeine, alcohol, tobacco, or drugs. Having gastrointestinal conditions, such as gastroesophageal reflux disease (GERD). Having certain medical conditions. These include: Asthma. Alzheimer's disease. Stroke. Chronic pain. An overactive thyroid gland (hyperthyroidism). Other sleep disorders, such as restless legs syndrome and sleep apnea. Menopause. Sometimes, the cause of insomnia may not be known. What increases the risk? Risk factors for insomnia include: Gender. Females are affected more often than males. Age. Insomnia is more common as people get older. Stress and certain medical and mental health conditions. Lack of exercise. Having an irregular work schedule. This may include working night shifts and traveling between different time zones. What are the signs or symptoms? If you have insomnia, the main symptom is having trouble falling asleep or having trouble staying asleep. This may lead to other symptoms, such as: Feeling tired or having low energy. Feeling nervous about going to sleep. Not feeling rested in the morning. Having trouble concentrating. Feeling irritable, anxious, or depressed. How is this diagnosed? This condition may be diagnosed based on: Your symptoms and medical history. Your health care provider may ask about: Your sleep  habits. Any  medical conditions you have. Your mental health. A physical exam. How is this treated? Treatment for insomnia depends on the cause. Treatment may focus on treating an underlying condition that is causing the insomnia. Treatment may also include: Medicines to help you sleep. Counseling or therapy. Lifestyle adjustments to help you sleep better. Follow these instructions at home: Eating and drinking  Limit or avoid alcohol, caffeinated beverages, and products that contain nicotine and tobacco, especially close to bedtime. These can disrupt your sleep. Do not eat a large meal or eat spicy foods right before bedtime. This can lead to digestive discomfort that can make it hard for you to sleep. Sleep habits  Keep a sleep diary to help you and your health care provider figure out what could be causing your insomnia. Write down: When you sleep. When you wake up during the night. How well you sleep and how rested you feel the next day. Any side effects of medicines you are taking. What you eat and drink. Make your bedroom a dark, comfortable place where it is easy to fall asleep. Put up shades or blackout curtains to block light from outside. Use a white noise machine to block noise. Keep the temperature cool. Limit screen use before bedtime. This includes: Not watching TV. Not using your smartphone, tablet, or computer. Stick to a routine that includes going to bed and waking up at the same times every day and night. This can help you fall asleep faster. Consider making a quiet activity, such as reading, part of your nighttime routine. Try to avoid taking naps during the day so that you sleep better at night. Get out of bed if you are still awake after 15 minutes of trying to sleep. Keep the lights down, but try reading or doing a quiet activity. When you feel sleepy, go back to bed. General instructions Take over-the-counter and prescription medicines only as told by your health care  provider. Exercise regularly as told by your health care provider. However, avoid exercising in the hours right before bedtime. Use relaxation techniques to manage stress. Ask your health care provider to suggest some techniques that may work well for you. These may include: Breathing exercises. Routines to release muscle tension. Visualizing peaceful scenes. Make sure that you drive carefully. Do not drive if you feel very sleepy. Keep all follow-up visits. This is important. Contact a health care provider if: You are tired throughout the day. You have trouble in your daily routine due to sleepiness. You continue to have sleep problems, or your sleep problems get worse. Get help right away if: You have thoughts about hurting yourself or someone else. Get help right away if you feel like you may hurt yourself or others, or have thoughts about taking your own life. Go to your nearest emergency room or: Call 911. Call the National Suicide Prevention Lifeline at 5101470033 or 988. This is open 24 hours a day. Text the Crisis Text Line at 707-458-6406. Summary Insomnia is a sleep disorder that makes it difficult to fall asleep or stay asleep. Insomnia can be long-term (chronic) or short-term (acute). Treatment for insomnia depends on the cause. Treatment may focus on treating an underlying condition that is causing the insomnia. Keep a sleep diary to help you and your health care provider figure out what could be causing your insomnia. This information is not intended to replace advice given to you by your health care provider. Make sure you discuss any questions you have  with your health care provider. Document Revised: 11/08/2021 Document Reviewed: 11/08/2021 Elsevier Patient Education  2024 [employer].

## 2024-02-22 NOTE — Progress Notes (Signed)
 New Patient Office Visit  Subjective    Patient ID: Anne Combs, female    DOB: 1966-10-11  Age: 58 y.o. MRN: [id number]  CC:  Chief Complaint  Patient presents with   Annual Exam   Menopause    Patient states she stopped her menstrual 5 years ago.   Patient started with hot flashes, mood swings, aggravation,in the last 3 months    emotional     Patient recently lost her brother and she is unable to visit him due to financial limitations    Discussed the use of AI scribe software for clinical note transcription with the patient, who gave verbal consent to proceed.  History of Present Illness         The patient, with a history of pre-diabetes and menopause, presents with hot flashes, mood swings, irritability, and sleep disturbances. She reports that these symptoms have been ongoing for approximately three months. The patient describes her sleep as inadequate, only managing to sleep for two to three hours per night. She has tried using melatonin in the past, but it left her feeling unwell.  She does sleep with the TV on.   The patient also reports experiencing mood swings and irritability, often leading to arguments with her spouse. She expresses frustration with these changes, as she did not experience such symptoms before.  In addition to these symptoms, the patient reports a dislike for water and a preference for caffeinated beverages such as coffee and sweetened green tea. She also consumes a protein shake daily. She acknowledges that she does not consume enough water and is likely dehydrated.  The patient also mentions a history of being on the borderline of pre-diabetes and has been advised to follow a diet to manage this. She reports a preference for dark chocolate and does not consume a lot of candy.  Outpatient Encounter Medications as of 02/22/2024  Medication Sig   traZODone (DESYREL) 50 MG tablet Take 0.5-1 tablets (25-50 mg total) by mouth at bedtime as needed  for sleep.   fluticasone (FLONASE) 50 MCG/ACT nasal spray Place 1 spray into both nostrils daily. (Patient not taking: Reported on 02/22/2024)   HYDROcodone-acetaminophen (NORCO/VICODIN) 5-325 MG tablet Take 0.5-1 tablets by mouth every 6 (six) hours as needed for severe pain (pain score 7-10). (Patient not taking: Reported on 02/22/2024)   naproxen (NAPROSYN) 500 MG tablet Take 1 tablet (500 mg total) by mouth 2 (two) times daily as needed (pain). (Patient not taking: Reported on 02/22/2024)   nicotine (NICODERM CQ - DOSED IN MG/24 HOURS) 14 mg/24hr patch Place 1 patch (14 mg total) onto the skin daily. (Patient not taking: Reported on 02/22/2024)   nicotine (NICODERM CQ - DOSED IN MG/24 HOURS) 21 mg/24hr patch Place 1 patch (21 mg total) onto the skin daily. (Patient not taking: Reported on 02/22/2024)   nicotine (NICODERM CQ - DOSED IN MG/24 HR) 7 mg/24hr patch Place 1 patch (7 mg total) onto the skin daily. (Patient not taking: Reported on 02/22/2024)   simvastatin (ZOCOR) 20 MG tablet Take 1 tablet (20 mg total) by mouth at bedtime. (Patient not taking: Reported on 02/22/2024)   [DISCONTINUED] traZODone (DESYREL) 50 MG tablet Take 0.5-1 tablets (25-50 mg total) by mouth at bedtime as needed for sleep. (Patient not taking: Reported on 02/22/2024)   No facility-administered encounter medications on file as of 02/22/2024.    Past Medical History:  Diagnosis Date   Anxiety    Depression    Hyperlipidemia  Hypertension 2006   [place]    Past Surgical History:  Procedure Laterality Date   TONSILLECTOMY     TONSILLECTOMY  1976   TUBAL LIGATION      Family History  Problem Relation Age of Onset   Bone cancer Mother    Cancer Father    Diabetes Father     Social History   Socioeconomic History   Marital status: Widowed    Spouse name: Not on file   Number of children: Not on file   Years of education: Not on file   Highest education level: Not on file  Occupational History   Not  on file  Tobacco Use   Smoking status: Every Day    Current packs/day: 0.75    Types: Cigarettes   Smokeless tobacco: Never  Substance and Sexual Activity   Alcohol use: Yes   Drug use: Never   Sexual activity: Yes    Birth control/protection: None  Other Topics Concern   Not on file  Social History Narrative   Husband died August 02, 2019 after 2 years of treatment for cancer   She was caregiver   Lost her home and now living with her son's family--paying rent for room   Has 2 dogs, one is a 65 month [place].   Social Drivers of Corporate investment banker Strain: Not on file  Food Insecurity: Not on file  Transportation Needs: Unknown (10/02/2019)   PRAPARE - Administrator, Civil Service (Medical): No    Lack of Transportation (Non-Medical): Not on file  Physical Activity: Not on file  Stress: Not on file  Social Connections: Not on file  Intimate Partner Violence: Unknown (03/18/2022)   [facility] from [facility]   HITS    Physically Hurt: Not on file    Insult or Talk Down To: Not on file    Threaten Physical Harm: Not on file    Scream or Curse: Not on file    Review of Systems  Constitutional: Negative.   HENT: Negative.    Eyes: Negative.   Respiratory:  Negative for shortness of breath.   Cardiovascular:  Negative for chest pain.  Gastrointestinal: Negative.   Genitourinary: Negative.   Musculoskeletal: Negative.   Skin: Negative.   Neurological: Negative.   Endo/Heme/Allergies: Negative.   Psychiatric/Behavioral:  The patient is nervous/anxious and has insomnia.         Objective    BP 129/83 (BP Location: Left Arm, Patient Position: Sitting, Cuff Size: Normal)   Pulse 100   Ht 5' (1.524 m)   Wt 125 lb (56.7 kg)   SpO2 98%   BMI 24.41 kg/m   Physical Exam Vitals and nursing note reviewed.  Constitutional:      Appearance: Normal appearance.  HENT:     Head: Normocephalic and atraumatic.     Right Ear: External  ear normal.     Left Ear: External ear normal.     Nose: Nose normal.     Mouth/Throat:     Mouth: Mucous membranes are moist.     Pharynx: Oropharynx is clear.  Eyes:     Extraocular Movements: Extraocular movements intact.     Conjunctiva/sclera: Conjunctivae normal.     Pupils: Pupils are equal, round, and reactive to light.  Cardiovascular:     Rate and Rhythm: Normal rate and regular rhythm.     Pulses: Normal pulses.     Heart sounds: Normal heart sounds.  Pulmonary:  Effort: Pulmonary effort is normal.     Breath sounds: Normal breath sounds.  Musculoskeletal:        General: Normal range of motion.     Cervical back: Normal range of motion and neck supple.  Skin:    General: Skin is warm and dry.  Neurological:     General: No focal deficit present.     Mental Status: She is alert.  Psychiatric:        Mood and Affect: Mood normal.        Behavior: Behavior normal.        Thought Content: Thought content normal.        Judgment: Judgment normal.       Assessment & Plan:   Problem List Items Addressed This Visit   None Visit Diagnoses       Menopausal symptoms    -  Primary   Relevant Orders   CBC with Differential/Platelet   Comp. Metabolic Panel (12)   TSH   Vitamin D, 25-hydroxy     Prediabetes       Relevant Orders   POCT glycosylated hemoglobin (Hb A1C) (Completed)     Psychophysiological insomnia       Relevant Medications   traZODone (DESYREL) 50 MG tablet     Encounter for screening mammogram for breast cancer       Relevant Orders   MM DIGITAL SCREENING BILATERAL     1. Menopausal symptoms (Primary) Symptoms consistent with menopause due to cessation of menstrual cycle 5-6 years ago.  - Encourage hydration: four bottles of water daily, flavor if needed. - Prescribe trazodone at bedtime for sleep, adjust dose as needed. - Advise reducing caffeine, eliminate TV at bedtime. - Discuss Paxil if symptoms persist. - Conduct lab work: thyroid,  kidney, liver function. Follow up with the [facility] in 4 weeks.  - CBC with Differential/Platelet - Comp. Metabolic Panel (12) - TSH - Vitamin D, 25-hydroxy  2. Prediabetes A1C 6.1.  Patient education given on low sugar diet - POCT glycosylated hemoglobin (Hb A1C)  3. Psychophysiological insomnia Trial trazodone. Patient education given on good sleep hygiene - traZODone (DESYREL) 50 MG tablet; Take 0.5-1 tablets (25-50 mg total) by mouth at bedtime as needed for sleep.  Dispense: 30 tablet; Refill: 1  4. Encounter for screening mammogram for breast cancer  - MM DIGITAL SCREENING BILATERAL; Future   I have reviewed the patient's medical history (PMH, PSH, Social History, Family History, Medications, and allergies) , and have been updated if relevant. I spent 30 minutes reviewing chart and  face to face time with patient.   Return in about 4 weeks (around 03/21/2024) for With [facility].   Kasandra Knudsen Mayers, PA-C

## 2024-02-24 LAB — CBC WITH DIFFERENTIAL/PLATELET
Basophils Absolute: 0.1 10*3/uL (ref 0.0–0.2)
Basos: 1 %
EOS (ABSOLUTE): 0.2 10*3/uL (ref 0.0–0.4)
Eos: 2 %
Hematocrit: 43.8 % (ref 34.0–46.6)
Hemoglobin: 14.4 g/dL (ref 11.1–15.9)
Immature Grans (Abs): 0 10*3/uL (ref 0.0–0.1)
Immature Granulocytes: 0 %
Lymphocytes Absolute: 4.3 10*3/uL — ABNORMAL HIGH (ref 0.7–3.1)
Lymphs: 50 %
MCH: 29.9 pg (ref 26.6–33.0)
MCHC: 32.9 g/dL (ref 31.5–35.7)
MCV: 91 fL (ref 79–97)
Monocytes Absolute: 0.6 10*3/uL (ref 0.1–0.9)
Monocytes: 7 %
Neutrophils Absolute: 3.4 10*3/uL (ref 1.4–7.0)
Neutrophils: 40 %
Platelets: 245 10*3/uL (ref 150–450)
RBC: 4.81 x10E6/uL (ref 3.77–5.28)
RDW: 12.7 % (ref 11.7–15.4)
WBC: 8.5 10*3/uL (ref 3.4–10.8)

## 2024-02-24 LAB — COMP. METABOLIC PANEL (12)
AST: 20 IU/L (ref 0–40)
Albumin: 4.8 g/dL (ref 3.8–4.9)
Alkaline Phosphatase: 93 IU/L (ref 44–121)
BUN/Creatinine Ratio: 16 (ref 9–23)
BUN: 12 mg/dL (ref 6–24)
Bilirubin Total: 0.2 mg/dL (ref 0.0–1.2)
Calcium: 9.6 mg/dL (ref 8.7–10.2)
Chloride: 101 mmol/L (ref 96–106)
Creatinine, Ser: 0.75 mg/dL (ref 0.57–1.00)
Globulin, Total: 2.5 g/dL (ref 1.5–4.5)
Glucose: 101 mg/dL — ABNORMAL HIGH (ref 70–99)
Potassium: 4.5 mmol/L (ref 3.5–5.2)
Sodium: 140 mmol/L (ref 134–144)
Total Protein: 7.3 g/dL (ref 6.0–8.5)
eGFR: 92 mL/min/{1.73_m2} (ref >59)

## 2024-02-24 LAB — TSH: TSH: 2.1 u[IU]/mL (ref 0.450–4.500)

## 2024-02-24 LAB — VITAMIN D 25 HYDROXY (VIT D DEFICIENCY, FRACTURES): Vit D, 25-Hydroxy: 24.3 ng/mL — ABNORMAL LOW (ref 30.0–100.0)
